# Patient Record
Sex: Female | Born: 1945
Health system: Southern US, Community
[De-identification: ages and names within clinical notes are randomized; demographics above are authoritative.]

## PROBLEM LIST (undated history)

## (undated) DIAGNOSIS — M1990 Unspecified osteoarthritis, unspecified site: Secondary | ICD-10-CM

## (undated) DIAGNOSIS — M8580 Other specified disorders of bone density and structure, unspecified site: Secondary | ICD-10-CM

## (undated) DIAGNOSIS — M6530 Trigger finger, unspecified finger: Secondary | ICD-10-CM

## (undated) DIAGNOSIS — H409 Unspecified glaucoma: Secondary | ICD-10-CM

## (undated) DIAGNOSIS — C50512 Malignant neoplasm of lower-outer quadrant of left female breast: Principal | ICD-10-CM

## (undated) DIAGNOSIS — IMO0002 Reserved for concepts with insufficient information to code with codable children: Secondary | ICD-10-CM

## (undated) DIAGNOSIS — K219 Gastro-esophageal reflux disease without esophagitis: Secondary | ICD-10-CM

## (undated) HISTORY — PX: CATARACT EXTRACTION: SUR2

## (undated) HISTORY — DX: Trigger finger, unspecified finger: M65.30

## (undated) HISTORY — PX: COLONOSCOPY: SHX174

## (undated) HISTORY — PX: TONSILLECTOMY: SUR1361

## (undated) HISTORY — DX: Malignant neoplasm of lower-outer quadrant of left female breast: C50.512

## (undated) HISTORY — DX: Reserved for concepts with insufficient information to code with codable children: IMO0002

## (undated) HISTORY — DX: Other specified disorders of bone density and structure, unspecified site: M85.80

---

## 1975-03-05 HISTORY — PX: TUBAL LIGATION: SHX77

## 1997-11-02 ENCOUNTER — Other Ambulatory Visit: Admission: RE | Admit: 1997-11-02 | Discharge: 1997-11-02 | Payer: Self-pay | Admitting: Obstetrics and Gynecology

## 1997-12-19 ENCOUNTER — Ambulatory Visit (HOSPITAL_COMMUNITY): Admission: RE | Admit: 1997-12-19 | Discharge: 1997-12-19 | Payer: Self-pay | Admitting: Obstetrics and Gynecology

## 1997-12-19 ENCOUNTER — Encounter: Payer: Self-pay | Admitting: Obstetrics and Gynecology

## 1998-12-18 ENCOUNTER — Other Ambulatory Visit: Admission: RE | Admit: 1998-12-18 | Discharge: 1998-12-18 | Payer: Self-pay | Admitting: Obstetrics and Gynecology

## 1998-12-22 ENCOUNTER — Encounter: Payer: Self-pay | Admitting: Obstetrics and Gynecology

## 1998-12-22 ENCOUNTER — Ambulatory Visit (HOSPITAL_COMMUNITY): Admission: RE | Admit: 1998-12-22 | Discharge: 1998-12-22 | Payer: Self-pay | Admitting: Obstetrics and Gynecology

## 1999-12-24 ENCOUNTER — Ambulatory Visit (HOSPITAL_COMMUNITY): Admission: RE | Admit: 1999-12-24 | Discharge: 1999-12-24 | Payer: Self-pay | Admitting: Obstetrics and Gynecology

## 1999-12-24 ENCOUNTER — Encounter: Payer: Self-pay | Admitting: Obstetrics and Gynecology

## 2000-03-10 ENCOUNTER — Other Ambulatory Visit: Admission: RE | Admit: 2000-03-10 | Discharge: 2000-03-10 | Payer: Self-pay | Admitting: Obstetrics and Gynecology

## 2000-12-26 ENCOUNTER — Encounter: Payer: Self-pay | Admitting: Obstetrics and Gynecology

## 2000-12-26 ENCOUNTER — Ambulatory Visit (HOSPITAL_COMMUNITY): Admission: RE | Admit: 2000-12-26 | Discharge: 2000-12-26 | Payer: Self-pay | Admitting: Obstetrics and Gynecology

## 2001-06-01 ENCOUNTER — Other Ambulatory Visit: Admission: RE | Admit: 2001-06-01 | Discharge: 2001-06-01 | Payer: Self-pay | Admitting: Obstetrics and Gynecology

## 2002-01-07 ENCOUNTER — Encounter: Payer: Self-pay | Admitting: Obstetrics and Gynecology

## 2002-01-07 ENCOUNTER — Ambulatory Visit (HOSPITAL_COMMUNITY): Admission: RE | Admit: 2002-01-07 | Discharge: 2002-01-07 | Payer: Self-pay | Admitting: Obstetrics and Gynecology

## 2002-06-15 ENCOUNTER — Other Ambulatory Visit: Admission: RE | Admit: 2002-06-15 | Discharge: 2002-06-15 | Payer: Self-pay | Admitting: Obstetrics and Gynecology

## 2003-01-21 ENCOUNTER — Ambulatory Visit (HOSPITAL_COMMUNITY): Admission: RE | Admit: 2003-01-21 | Discharge: 2003-01-21 | Payer: Self-pay | Admitting: Obstetrics and Gynecology

## 2003-07-05 ENCOUNTER — Other Ambulatory Visit: Admission: RE | Admit: 2003-07-05 | Discharge: 2003-07-05 | Payer: Self-pay | Admitting: Obstetrics and Gynecology

## 2004-02-01 ENCOUNTER — Ambulatory Visit (HOSPITAL_COMMUNITY): Admission: RE | Admit: 2004-02-01 | Discharge: 2004-02-01 | Payer: Self-pay | Admitting: Obstetrics and Gynecology

## 2004-07-05 ENCOUNTER — Other Ambulatory Visit: Admission: RE | Admit: 2004-07-05 | Discharge: 2004-07-05 | Payer: Self-pay | Admitting: Addiction Medicine

## 2005-02-13 ENCOUNTER — Ambulatory Visit (HOSPITAL_COMMUNITY): Admission: RE | Admit: 2005-02-13 | Discharge: 2005-02-13 | Payer: Self-pay | Admitting: Obstetrics and Gynecology

## 2005-07-16 ENCOUNTER — Other Ambulatory Visit: Admission: RE | Admit: 2005-07-16 | Discharge: 2005-07-16 | Payer: Self-pay | Admitting: Obstetrics and Gynecology

## 2006-02-20 ENCOUNTER — Ambulatory Visit (HOSPITAL_COMMUNITY): Admission: RE | Admit: 2006-02-20 | Discharge: 2006-02-20 | Payer: Self-pay | Admitting: Obstetrics and Gynecology

## 2006-07-30 ENCOUNTER — Other Ambulatory Visit: Admission: RE | Admit: 2006-07-30 | Discharge: 2006-07-30 | Payer: Self-pay | Admitting: Obstetrics and Gynecology

## 2007-02-23 ENCOUNTER — Ambulatory Visit (HOSPITAL_COMMUNITY): Admission: RE | Admit: 2007-02-23 | Discharge: 2007-02-23 | Payer: Self-pay | Admitting: Obstetrics and Gynecology

## 2007-07-31 ENCOUNTER — Other Ambulatory Visit: Admission: RE | Admit: 2007-07-31 | Discharge: 2007-07-31 | Payer: Self-pay | Admitting: Obstetrics and Gynecology

## 2008-02-24 ENCOUNTER — Ambulatory Visit (HOSPITAL_COMMUNITY): Admission: RE | Admit: 2008-02-24 | Discharge: 2008-02-24 | Payer: Self-pay | Admitting: Obstetrics and Gynecology

## 2008-08-08 ENCOUNTER — Other Ambulatory Visit: Admission: RE | Admit: 2008-08-08 | Discharge: 2008-08-08 | Payer: Self-pay | Admitting: Obstetrics and Gynecology

## 2008-08-08 ENCOUNTER — Encounter: Payer: Self-pay | Admitting: Obstetrics and Gynecology

## 2008-08-08 ENCOUNTER — Ambulatory Visit: Payer: Self-pay | Admitting: Obstetrics and Gynecology

## 2009-03-24 ENCOUNTER — Ambulatory Visit (HOSPITAL_COMMUNITY): Admission: RE | Admit: 2009-03-24 | Discharge: 2009-03-24 | Payer: Self-pay | Admitting: Obstetrics and Gynecology

## 2009-08-15 ENCOUNTER — Ambulatory Visit: Payer: Self-pay | Admitting: Obstetrics and Gynecology

## 2009-08-15 ENCOUNTER — Other Ambulatory Visit: Admission: RE | Admit: 2009-08-15 | Discharge: 2009-08-15 | Payer: Self-pay | Admitting: Obstetrics and Gynecology

## 2010-03-25 ENCOUNTER — Encounter: Payer: Self-pay | Admitting: Obstetrics and Gynecology

## 2010-04-03 ENCOUNTER — Ambulatory Visit (HOSPITAL_COMMUNITY)
Admission: RE | Admit: 2010-04-03 | Discharge: 2010-04-03 | Payer: Self-pay | Source: Home / Self Care | Attending: Obstetrics and Gynecology | Admitting: Obstetrics and Gynecology

## 2010-08-22 ENCOUNTER — Encounter: Payer: Self-pay | Admitting: Obstetrics and Gynecology

## 2010-08-22 ENCOUNTER — Encounter (INDEPENDENT_AMBULATORY_CARE_PROVIDER_SITE_OTHER): Payer: BC Managed Care – PPO | Admitting: Obstetrics and Gynecology

## 2010-08-22 ENCOUNTER — Other Ambulatory Visit: Payer: Self-pay | Admitting: Obstetrics and Gynecology

## 2010-08-22 ENCOUNTER — Other Ambulatory Visit (HOSPITAL_COMMUNITY)
Admission: RE | Admit: 2010-08-22 | Discharge: 2010-08-22 | Disposition: A | Discharge status: Home / Self Care | Payer: BC Managed Care – PPO | Source: Ambulatory Visit | Attending: Obstetrics and Gynecology | Admitting: Obstetrics and Gynecology

## 2010-08-22 DIAGNOSIS — R809 Proteinuria, unspecified: Secondary | ICD-10-CM

## 2010-08-22 DIAGNOSIS — Z01419 Encounter for gynecological examination (general) (routine) without abnormal findings: Secondary | ICD-10-CM

## 2010-08-22 DIAGNOSIS — Z124 Encounter for screening for malignant neoplasm of cervix: Secondary | ICD-10-CM | POA: Insufficient documentation

## 2010-09-04 ENCOUNTER — Encounter (INDEPENDENT_AMBULATORY_CARE_PROVIDER_SITE_OTHER): Payer: BC Managed Care – PPO

## 2010-09-04 DIAGNOSIS — M949 Disorder of cartilage, unspecified: Secondary | ICD-10-CM

## 2010-09-04 DIAGNOSIS — M899 Disorder of bone, unspecified: Secondary | ICD-10-CM

## 2010-09-06 ENCOUNTER — Encounter: Payer: Self-pay | Admitting: Obstetrics and Gynecology

## 2011-03-11 ENCOUNTER — Other Ambulatory Visit: Payer: Self-pay | Admitting: Obstetrics and Gynecology

## 2011-03-11 DIAGNOSIS — Z1231 Encounter for screening mammogram for malignant neoplasm of breast: Secondary | ICD-10-CM

## 2011-04-08 ENCOUNTER — Ambulatory Visit (HOSPITAL_COMMUNITY): Payer: BC Managed Care – PPO | Attending: Obstetrics and Gynecology

## 2011-05-09 ENCOUNTER — Ambulatory Visit (HOSPITAL_COMMUNITY)
Admission: RE | Admit: 2011-05-09 | Discharge: 2011-05-09 | Disposition: A | Discharge status: Home / Self Care | Payer: Medicare Other | Source: Ambulatory Visit | Attending: Obstetrics and Gynecology | Admitting: Obstetrics and Gynecology

## 2011-05-09 DIAGNOSIS — Z1231 Encounter for screening mammogram for malignant neoplasm of breast: Secondary | ICD-10-CM | POA: Insufficient documentation

## 2011-05-16 ENCOUNTER — Other Ambulatory Visit: Payer: Self-pay | Admitting: *Deleted

## 2011-05-16 DIAGNOSIS — R921 Mammographic calcification found on diagnostic imaging of breast: Secondary | ICD-10-CM

## 2011-05-22 ENCOUNTER — Ambulatory Visit
Admission: RE | Admit: 2011-05-22 | Discharge: 2011-05-22 | Disposition: A | Discharge status: Home / Self Care | Payer: Medicare Other | Source: Ambulatory Visit | Attending: Gynecology | Admitting: Gynecology

## 2011-05-22 DIAGNOSIS — R921 Mammographic calcification found on diagnostic imaging of breast: Secondary | ICD-10-CM

## 2011-08-27 ENCOUNTER — Encounter: Payer: Self-pay | Admitting: Obstetrics and Gynecology

## 2011-08-27 ENCOUNTER — Ambulatory Visit (INDEPENDENT_AMBULATORY_CARE_PROVIDER_SITE_OTHER): Payer: Medicare Other | Admitting: Obstetrics and Gynecology

## 2011-08-27 VITALS — BP 120/72 | Ht 62.0 in | Wt 156.0 lb

## 2011-08-27 DIAGNOSIS — Z780 Asymptomatic menopausal state: Secondary | ICD-10-CM

## 2011-08-27 DIAGNOSIS — R351 Nocturia: Secondary | ICD-10-CM

## 2011-08-27 DIAGNOSIS — N952 Postmenopausal atrophic vaginitis: Secondary | ICD-10-CM

## 2011-08-27 DIAGNOSIS — H409 Unspecified glaucoma: Secondary | ICD-10-CM | POA: Insufficient documentation

## 2011-08-27 DIAGNOSIS — M949 Disorder of cartilage, unspecified: Secondary | ICD-10-CM

## 2011-08-27 DIAGNOSIS — R928 Other abnormal and inconclusive findings on diagnostic imaging of breast: Secondary | ICD-10-CM

## 2011-08-27 DIAGNOSIS — M8580 Other specified disorders of bone density and structure, unspecified site: Secondary | ICD-10-CM | POA: Insufficient documentation

## 2011-08-27 DIAGNOSIS — R921 Mammographic calcification found on diagnostic imaging of breast: Secondary | ICD-10-CM

## 2011-08-27 NOTE — Progress Notes (Signed)
Patient came to see me today for further followup. When she went for her mammogram in March she had calcifications in her left breast. She had additional views and they were felt to be benign. She is scheduled for six-month followup. She does not take hormone replacement. She does have some mild menopausal symptoms consisting of hot flashes but they are tolerable. She does have atrophic vaginitis with intercourse is comfortable. She will get occasional nocturia x1. She is not having dysuria, urgency, hematuria or incontinence. She is having no vaginal bleeding. She is having no pelvic pain. She has always had normal Pap smears. Her last Pap smear was last year. She has osteopenia on bone density. Her last bone density was July of 2012. She had lost some additional bone in  her hip. Her fracture risk was nonelevated. She takes calcium and vitamin D. She has had no fractures.  ROS: 12 system review done. Pertinent positives above. Other positive  is glaucoma.  Physical examination:Kim Gardner present HEENT within normal limits. Neck: Thyroid not large. No masses. Supraclavicular nodes: not enlarged. Breasts: Examined in both sitting and lying  position. No skin changes and no masses. Abdomen: Soft no guarding rebound or masses or hernia. Pelvic: External: Within normal limits. BUS: Within normal limits. Vaginal:within normal limits. Poor  estrogen effect. No evidence of cystocele rectocele or enterocele. Cervix: clean. Uterus: Normal size and shape. Adnexa: No masses. Rectovaginal exam: Confirmatory and negative. Extremities: Within normal limits.  Assessment: #1. Atrophic vaginitis #2. Menopausal symptoms #3. Calcifications left breast-nonsuspicious #4. Osteopenia #5. Nocturia  Plan: Mammogram in September. Bone density in 2014. Call when necessary need for estrogen cream.

## 2011-09-02 ENCOUNTER — Encounter: Payer: Self-pay | Admitting: Obstetrics and Gynecology

## 2011-11-19 ENCOUNTER — Ambulatory Visit
Admission: RE | Admit: 2011-11-19 | Discharge: 2011-11-19 | Disposition: A | Discharge status: Home / Self Care | Payer: Medicare Other | Source: Ambulatory Visit | Attending: Gynecology | Admitting: Gynecology

## 2011-11-19 DIAGNOSIS — R921 Mammographic calcification found on diagnostic imaging of breast: Secondary | ICD-10-CM

## 2012-04-21 ENCOUNTER — Other Ambulatory Visit: Payer: Self-pay | Admitting: Family Medicine

## 2012-04-21 DIAGNOSIS — R921 Mammographic calcification found on diagnostic imaging of breast: Secondary | ICD-10-CM

## 2012-05-20 ENCOUNTER — Ambulatory Visit
Admission: RE | Admit: 2012-05-20 | Discharge: 2012-05-20 | Disposition: A | Discharge status: Home / Self Care | Payer: Medicare Other | Source: Ambulatory Visit | Attending: Family Medicine | Admitting: Family Medicine

## 2012-05-20 DIAGNOSIS — R921 Mammographic calcification found on diagnostic imaging of breast: Secondary | ICD-10-CM

## 2012-09-08 ENCOUNTER — Ambulatory Visit (INDEPENDENT_AMBULATORY_CARE_PROVIDER_SITE_OTHER): Payer: Medicare Other | Admitting: Gynecology

## 2012-09-08 ENCOUNTER — Encounter: Payer: Self-pay | Admitting: Gynecology

## 2012-09-08 VITALS — BP 104/64 | Ht 61.25 in | Wt 153.0 lb

## 2012-09-08 DIAGNOSIS — M8580 Other specified disorders of bone density and structure, unspecified site: Secondary | ICD-10-CM

## 2012-09-08 DIAGNOSIS — M949 Disorder of cartilage, unspecified: Secondary | ICD-10-CM

## 2012-09-08 NOTE — Progress Notes (Signed)
Kiara Ruiz 1945-06-16 [id number]        67 y.o.  G1P1001 for followup exam.  Former patient of Dr. Eda Paschal.  Past medical history,surgical history, medications, allergies, family history and social history were all reviewed and documented in the EPIC chart.  ROS:  Performed and pertinent positives and negatives are included in the history, assessment and plan .  Exam: [name] assistant Filed Vitals:   09/08/12 1456  BP: 104/64  Height: 5' 1.25" (1.556 m)  Weight: 153 lb (69.4 kg)   General appearance  Normal Skin grossly normal Head/Neck normal with no cervical or supraclavicular adenopathy thyroid normal Lungs  clear Cardiac RR, without RMG Abdominal  soft, nontender, without masses, organomegaly or hernia Breasts  examined lying and sitting without masses, retractions, discharge or axillary adenopathy. Pelvic  Ext/BUS/vagina  normal with atrophic changes  Cervix  normal with atrophic changes flush with the upper vagina  Uterus  axial, normal size, shape and contour, midline and mobile nontender   Adnexa  Without masses or tenderness    Anus and perineum  normal   Rectovaginal  normal sphincter tone without palpated masses or tenderness.    Assessment/Plan:  67 y.o. G60P1001 female for annual exam.   1. Atrophic vaginal changes/post menopausal. Patient without significant symptoms such as hot flashes night sweats vaginal dryness or dyspareunia. We'll continue to monitor. She'll call if there any issues or she has any bleeding at all. 2. Osteopenia. DEXA 7 2012 with T score -1.8 FRAX 4.4%/0.6%. Increase calcium vitamin D reviewed. Repeat DEXA now a two-year interval. 3. Mammography 05/2012. Continue with annual mammography. SBE monthly reviewed. 4. Pap smear 2012. No Pap smear done today. No history of abnormal Pap smears previously. Reviewed current screening guidelines. Options discussed renal together she is over the age of 32 versus less frequent screening intervals  reviewed. We'll readdress on an annual basis. 5. Colonoscopy 2008. She was recommended to repeat at 10 year interval. 6. Health maintenance. No lab work done as it is all done through her primary physician's office. Followup one year, sooner as needed.   Note: This document was prepared with digital dictation and possible smart phrase technology. Any transcriptional errors that result from this process are unintentional.   Dara Lords MD, 3:23 PM 09/08/2012

## 2012-09-08 NOTE — Patient Instructions (Signed)
Follow up in one year for annual exam 

## 2012-11-05 ENCOUNTER — Encounter: Payer: Self-pay | Admitting: Gynecology

## 2012-11-05 ENCOUNTER — Ambulatory Visit (INDEPENDENT_AMBULATORY_CARE_PROVIDER_SITE_OTHER): Payer: Medicare Other

## 2012-11-05 DIAGNOSIS — M8580 Other specified disorders of bone density and structure, unspecified site: Secondary | ICD-10-CM

## 2012-11-05 DIAGNOSIS — M899 Disorder of bone, unspecified: Secondary | ICD-10-CM

## 2012-11-26 ENCOUNTER — Ambulatory Visit
Admission: RE | Admit: 2012-11-26 | Discharge: 2012-11-26 | Disposition: A | Discharge status: Home / Self Care | Payer: 59 | Source: Ambulatory Visit | Attending: Family Medicine | Admitting: Family Medicine

## 2012-11-26 ENCOUNTER — Other Ambulatory Visit: Payer: Self-pay | Admitting: Family Medicine

## 2012-11-26 DIAGNOSIS — M12529 Traumatic arthropathy, unspecified elbow: Secondary | ICD-10-CM

## 2013-04-20 ENCOUNTER — Other Ambulatory Visit: Payer: Self-pay | Admitting: Gynecology

## 2013-04-20 DIAGNOSIS — R921 Mammographic calcification found on diagnostic imaging of breast: Secondary | ICD-10-CM

## 2013-05-24 ENCOUNTER — Ambulatory Visit
Admission: RE | Admit: 2013-05-24 | Discharge: 2013-05-24 | Disposition: A | Discharge status: Home / Self Care | Payer: Medicare Other | Source: Ambulatory Visit | Attending: Gynecology | Admitting: Gynecology

## 2013-05-24 DIAGNOSIS — R921 Mammographic calcification found on diagnostic imaging of breast: Secondary | ICD-10-CM

## 2013-06-11 ENCOUNTER — Ambulatory Visit
Admission: RE | Admit: 2013-06-11 | Discharge: 2013-06-11 | Disposition: A | Discharge status: Home / Self Care | Payer: Medicare Other | Source: Ambulatory Visit | Attending: Family Medicine | Admitting: Family Medicine

## 2013-06-11 ENCOUNTER — Other Ambulatory Visit: Payer: Self-pay | Admitting: Family Medicine

## 2013-06-11 DIAGNOSIS — M1250 Traumatic arthropathy, unspecified site: Secondary | ICD-10-CM

## 2013-09-01 DIAGNOSIS — IMO0002 Reserved for concepts with insufficient information to code with codable children: Secondary | ICD-10-CM

## 2013-09-01 HISTORY — DX: Reserved for concepts with insufficient information to code with codable children: IMO0002

## 2013-09-09 ENCOUNTER — Ambulatory Visit (INDEPENDENT_AMBULATORY_CARE_PROVIDER_SITE_OTHER): Payer: Medicare Other | Admitting: Gynecology

## 2013-09-09 ENCOUNTER — Other Ambulatory Visit (HOSPITAL_COMMUNITY)
Admission: RE | Admit: 2013-09-09 | Discharge: 2013-09-09 | Disposition: A | Discharge status: Home / Self Care | Payer: Medicare Other | Source: Ambulatory Visit | Attending: Gynecology | Admitting: Gynecology

## 2013-09-09 ENCOUNTER — Encounter: Payer: Self-pay | Admitting: Gynecology

## 2013-09-09 VITALS — BP 120/70 | Ht 62.0 in | Wt 156.0 lb

## 2013-09-09 DIAGNOSIS — Z1151 Encounter for screening for human papillomavirus (HPV): Secondary | ICD-10-CM | POA: Diagnosis present

## 2013-09-09 DIAGNOSIS — Z124 Encounter for screening for malignant neoplasm of cervix: Secondary | ICD-10-CM | POA: Diagnosis present

## 2013-09-09 DIAGNOSIS — R87619 Unspecified abnormal cytological findings in specimens from cervix uteri: Secondary | ICD-10-CM | POA: Diagnosis present

## 2013-09-09 DIAGNOSIS — N952 Postmenopausal atrophic vaginitis: Secondary | ICD-10-CM

## 2013-09-09 DIAGNOSIS — M899 Disorder of bone, unspecified: Secondary | ICD-10-CM

## 2013-09-09 DIAGNOSIS — M8580 Other specified disorders of bone density and structure, unspecified site: Secondary | ICD-10-CM

## 2013-09-09 DIAGNOSIS — M949 Disorder of cartilage, unspecified: Secondary | ICD-10-CM

## 2013-09-09 NOTE — Progress Notes (Signed)
Kiara Ruiz 10/07/1945 [id number]        68 y.o.  G1P1001 for followup exam. Several issues noted below.  Past medical history,surgical history, problem list, medications, allergies, family history and social history were all reviewed and documented as reviewed in the EPIC chart.  ROS:  12 system ROS performed with pertinent positives and negatives included in the history, assessment and plan.   Additional significant findings :  None   Exam: Kim Counsellor Vitals:   09/09/13 1432  BP: 120/70  Height: 5\' 2"  (1.575 m)  Weight: 156 lb (70.761 kg)   General appearance:  Normal affect, orientation and appearance. Skin: Grossly normal HEENT: Without gross lesions.  No cervical or supraclavicular adenopathy. Thyroid normal.  Lungs:  Clear without wheezing, rales or rhonchi Cardiac: RR, without RMG Abdominal:  Soft, nontender, without masses, guarding, rebound, organomegaly or hernia Breasts:  Examined lying and sitting without masses, retractions, discharge or axillary adenopathy. Pelvic:  Ext/BUS/vagina with generalized atrophic changes  Cervix with atrophic changes. Pap done  Uterus anteverted, normal size, shape and contour, midline and mobile nontender   Adnexa  Without masses or tenderness    Anus and perineum  Normal   Rectovaginal  Normal sphincter tone without palpated masses or tenderness.    Assessment/Plan:  68 y.o. G1P1001 female for followup exam.   1. Postmenopausal/atrophic genital changes. Doing well without significant hot flushes, night sweats, vaginal dryness. Is not sexually active. No vaginal bleeding. Will continue to monitor. Report any vaginal bleeding. 2. Osteopenia. DEXA 11/2012 T score -1.8. FRAX 4.6%/0.7%. Stable from prior DEXA. Continue to monitor. Increase calcium vitamin D reviewed. 3. Pap smear 2012. Pap done today. No history of significant abnormal Pap smears. Reviewed options of stop screening versus less frequent screening intervals per  current screening guidelines. We'll readdress on an annual basis. 4. Mammography 05/2013. Continue with annual mammography. SBE monthly review. 5. Colonoscopy 2008. Repeat at their recommended interval. 6. Health maintenance. No blood work done as this is done at her primary physician's office. Followup one year, sooner as needed   Note: This document was prepared with digital dictation and possible smart phrase technology. Any transcriptional errors that result from this process are unintentional.   Anastasio Auerbach MD, 2:49 PM 09/09/2013

## 2013-09-09 NOTE — Addendum Note (Signed)
Addended by: Nelva Nay on: 09/09/2013 02:55 PM   Modules accepted: Orders

## 2013-09-09 NOTE — Patient Instructions (Signed)
Followup in one year, sooner as needed.  You may obtain a copy of any labs that were done today by logging onto MyChart as outlined in the instructions provided with your AVS (after visit summary). The office will not call with normal lab results but certainly if there are any significant abnormalities then we will contact you.   Health Maintenance, Female A healthy lifestyle and preventative care can promote health and wellness.  Maintain regular health, dental, and eye exams.  Eat a healthy diet. Foods like vegetables, fruits, whole grains, low-fat dairy products, and lean protein foods contain the nutrients you need without too many calories. Decrease your intake of foods high in solid fats, added sugars, and salt. Get information about a proper diet from your caregiver, if necessary.  Regular physical exercise is one of the most important things you can do for your health. Most adults should get at least 150 minutes of moderate-intensity exercise (any activity that increases your heart rate and causes you to sweat) each week. In addition, most adults need muscle-strengthening exercises on 2 or more days a week.   Maintain a healthy weight. The body mass index (BMI) is a screening tool to identify possible weight problems. It provides an estimate of body fat based on height and weight. Your caregiver can help determine your BMI, and can help you achieve or maintain a healthy weight. For adults 20 years and older:  A BMI below 18.5 is considered underweight.  A BMI of 18.5 to 24.9 is normal.  A BMI of 25 to 29.9 is considered overweight.  A BMI of 30 and above is considered obese.  Maintain normal blood lipids and cholesterol by exercising and minimizing your intake of saturated fat. Eat a balanced diet with plenty of fruits and vegetables. Blood tests for lipids and cholesterol should begin at age 41 and be repeated every 5 years. If your lipid or cholesterol levels are high, you are over  50, or you are a high risk for heart disease, you may need your cholesterol levels checked more frequently.Ongoing high lipid and cholesterol levels should be treated with medicines if diet and exercise are not effective.  If you smoke, find out from your caregiver how to quit. If you do not use tobacco, do not start.  Lung cancer screening is recommended for adults aged 49 80 years who are at high risk for developing lung cancer because of a history of smoking. Yearly low-dose computed tomography (CT) is recommended for people who have at least a 30-pack-year history of smoking and are a current smoker or have quit within the past 15 years. A pack year of smoking is smoking an average of 1 pack of cigarettes a day for 1 year (for example: 1 pack a day for 30 years or 2 packs a day for 15 years). Yearly screening should continue until the smoker has stopped smoking for at least 15 years. Yearly screening should also be stopped for people who develop a health problem that would prevent them from having lung cancer treatment.  If you are pregnant, do not drink alcohol. If you are breastfeeding, be very cautious about drinking alcohol. If you are not pregnant and choose to drink alcohol, do not exceed 1 drink per day. One drink is considered to be 12 ounces (355 mL) of beer, 5 ounces (148 mL) of wine, or 1.5 ounces (44 mL) of liquor.  Avoid use of street drugs. Do not share needles with anyone. Ask for help  if you need support or instructions about stopping the use of drugs.  High blood pressure causes heart disease and increases the risk of stroke. Blood pressure should be checked at least every 1 to 2 years. Ongoing high blood pressure should be treated with medicines, if weight loss and exercise are not effective.  If you are 55 to 69 years old, ask your caregiver if you should take aspirin to prevent strokes.  Diabetes screening involves taking a blood sample to check your fasting blood sugar level.  This should be done once every 3 years, after age 45, if you are within normal weight and without risk factors for diabetes. Testing should be considered at a younger age or be carried out more frequently if you are overweight and have at least 1 risk factor for diabetes.  Breast cancer screening is essential preventative care for women. You should practice "breast self-awareness." This means understanding the normal appearance and feel of your breasts and may include breast self-examination. Any changes detected, no matter how small, should be reported to a caregiver. Women in their 20s and 30s should have a clinical breast exam (CBE) by a caregiver as part of a regular health exam every 1 to 3 years. After age 40, women should have a CBE every year. Starting at age 40, women should consider having a mammogram (breast X-Meyers) every year. Women who have a family history of breast cancer should talk to their caregiver about genetic screening. Women at a high risk of breast cancer should talk to their caregiver about having an MRI and a mammogram every year.  Breast cancer gene (BRCA)-related cancer risk assessment is recommended for women who have family members with BRCA-related cancers. BRCA-related cancers include breast, ovarian, tubal, and peritoneal cancers. Having family members with these cancers may be associated with an increased risk for harmful changes (mutations) in the breast cancer genes BRCA1 and BRCA2. Results of the assessment will determine the need for genetic counseling and BRCA1 and BRCA2 testing.  The Pap test is a screening test for cervical cancer. Women should have a Pap test starting at age 21. Between ages 21 and 29, Pap tests should be repeated every 2 years. Beginning at age 30, you should have a Pap test every 3 years as long as the past 3 Pap tests have been normal. If you had a hysterectomy for a problem that was not cancer or a condition that could lead to cancer, then you no  longer need Pap tests. If you are between ages 65 and 70, and you have had normal Pap tests going back 10 years, you no longer need Pap tests. If you have had past treatment for cervical cancer or a condition that could lead to cancer, you need Pap tests and screening for cancer for at least 20 years after your treatment. If Pap tests have been discontinued, risk factors (such as a new sexual partner) need to be reassessed to determine if screening should be resumed. Some women have medical problems that increase the chance of getting cervical cancer. In these cases, your caregiver may recommend more frequent screening and Pap tests.  The human papillomavirus (HPV) test is an additional test that may be used for cervical cancer screening. The HPV test looks for the virus that can cause the cell changes on the cervix. The cells collected during the Pap test can be tested for HPV. The HPV test could be used to screen women aged 30 years and older, and   should be used in women of any age who have unclear Pap test results. After the age of 30, women should have HPV testing at the same frequency as a Pap test.  Colorectal cancer can be detected and often prevented. Most routine colorectal cancer screening begins at the age of 50 and continues through age 75. However, your caregiver may recommend screening at an earlier age if you have risk factors for colon cancer. On a yearly basis, your caregiver may provide home test kits to check for hidden blood in the stool. Use of a small camera at the end of a tube, to directly examine the colon (sigmoidoscopy or colonoscopy), can detect the earliest forms of colorectal cancer. Talk to your caregiver about this at age 50, when routine screening begins. Direct examination of the colon should be repeated every 5 to 10 years through age 75, unless early forms of pre-cancerous polyps or small growths are found.  Hepatitis C blood testing is recommended for all people born from  1945 through 1965 and any individual with known risks for hepatitis C.  Practice safe sex. Use condoms and avoid high-risk sexual practices to reduce the spread of sexually transmitted infections (STIs). Sexually active women aged 25 and younger should be checked for Chlamydia, which is a common sexually transmitted infection. Older women with new or multiple partners should also be tested for Chlamydia. Testing for other STIs is recommended if you are sexually active and at increased risk.  Osteoporosis is a disease in which the bones lose minerals and strength with aging. This can result in serious bone fractures. The risk of osteoporosis can be identified using a bone density scan. Women ages 65 and over and women at risk for fractures or osteoporosis should discuss screening with their caregivers. Ask your caregiver whether you should be taking a calcium supplement or vitamin D to reduce the rate of osteoporosis.  Menopause can be associated with physical symptoms and risks. Hormone replacement therapy is available to decrease symptoms and risks. You should talk to your caregiver about whether hormone replacement therapy is right for you.  Use sunscreen. Apply sunscreen liberally and repeatedly throughout the day. You should seek shade when your shadow is shorter than you. Protect yourself by wearing long sleeves, pants, a wide-brimmed hat, and sunglasses year round, whenever you are outdoors.  Notify your caregiver of new moles or changes in moles, especially if there is a change in shape or color. Also notify your caregiver if a mole is larger than the size of a pencil eraser.  Stay current with your immunizations. Document Released: 09/03/2010 Document Revised: 06/15/2012 Document Reviewed: 09/03/2010 [employer] Patient Information 2014 [employer].   

## 2013-09-13 LAB — CYTOLOGY - PAP

## 2013-09-16 ENCOUNTER — Encounter: Payer: Self-pay | Admitting: Gynecology

## 2014-01-03 ENCOUNTER — Encounter: Payer: Self-pay | Admitting: Gynecology

## 2014-04-15 ENCOUNTER — Other Ambulatory Visit (HOSPITAL_COMMUNITY)
Admission: RE | Admit: 2014-04-15 | Discharge: 2014-04-15 | Disposition: A | Discharge status: Home / Self Care | Payer: 59 | Source: Ambulatory Visit | Attending: Gynecology | Admitting: Gynecology

## 2014-04-15 ENCOUNTER — Other Ambulatory Visit: Payer: Self-pay

## 2014-04-15 ENCOUNTER — Ambulatory Visit (INDEPENDENT_AMBULATORY_CARE_PROVIDER_SITE_OTHER): Payer: Medicare Other | Admitting: Gynecology

## 2014-04-15 ENCOUNTER — Encounter: Payer: Self-pay | Admitting: Gynecology

## 2014-04-15 VITALS — BP 118/80 | Wt 156.0 lb

## 2014-04-15 DIAGNOSIS — IMO0002 Reserved for concepts with insufficient information to code with codable children: Secondary | ICD-10-CM

## 2014-04-15 DIAGNOSIS — Z1231 Encounter for screening mammogram for malignant neoplasm of breast: Secondary | ICD-10-CM

## 2014-04-15 DIAGNOSIS — Z01411 Encounter for gynecological examination (general) (routine) with abnormal findings: Secondary | ICD-10-CM | POA: Insufficient documentation

## 2014-04-15 DIAGNOSIS — R896 Abnormal cytological findings in specimens from other organs, systems and tissues: Secondary | ICD-10-CM

## 2014-04-15 NOTE — Patient Instructions (Signed)
Follow-up for Pap smear results. 

## 2014-04-15 NOTE — Progress Notes (Signed)
Kiara Ruiz Nov 16, 1945 [id number]        69 y.o.  G1P1001 Presents for repeat Pap smear.  No history of abnormal Pap smears until her most recent Pap smear 09/2013 which showed ASCUS negative high-risk HPV. I asked the patient return in 6 months to repeat this to make sure that we are not missing anything.  Past medical history,surgical history, problem list, medications, allergies, family history and social history were all reviewed and documented in the EPIC chart.  Directed ROS with pertinent positives and negatives documented in the history of present illness/assessment and plan.  Exam: Kiara Ruiz assistant General appearance:  Normal External BUS vagina with atrophic changes. Cervix with atrophic changes. Pap smear done. Uterus grossly normal size midline mobile nontender. Adnexa without masses or tenderness.  Assessment/Plan:  69 y.o. G1P1001 with first abnormal Pap smear showing ASCUS negative high-risk HPV. Pap smear done today. If abnormal then we'll plan colposcopy. If normal then plan follow up at her annual exam.     Kiara Auerbach MD, 11:41 AM 04/15/2014

## 2014-04-15 NOTE — Addendum Note (Signed)
Addended by: Burnett Kanaris on: 04/15/2014 11:50 AM   Modules accepted: Orders

## 2014-04-19 LAB — CYTOLOGY - PAP

## 2014-06-01 ENCOUNTER — Ambulatory Visit
Admission: RE | Admit: 2014-06-01 | Discharge: 2014-06-01 | Disposition: A | Discharge status: Home / Self Care | Payer: Medicare Other | Source: Ambulatory Visit

## 2014-06-01 ENCOUNTER — Encounter (HOSPITAL_COMMUNITY): Payer: Self-pay | Admitting: *Deleted

## 2014-06-01 ENCOUNTER — Emergency Department (HOSPITAL_COMMUNITY)
Admission: EM | Admit: 2014-06-01 | Discharge: 2014-06-01 | Disposition: A | Discharge status: Home / Self Care | Payer: Medicare Other | Attending: Emergency Medicine | Admitting: Emergency Medicine

## 2014-06-01 DIAGNOSIS — Z8669 Personal history of other diseases of the nervous system and sense organs: Secondary | ICD-10-CM | POA: Diagnosis not present

## 2014-06-01 DIAGNOSIS — F458 Other somatoform disorders: Secondary | ICD-10-CM | POA: Diagnosis not present

## 2014-06-01 DIAGNOSIS — Z79899 Other long term (current) drug therapy: Secondary | ICD-10-CM | POA: Diagnosis not present

## 2014-06-01 DIAGNOSIS — Z8739 Personal history of other diseases of the musculoskeletal system and connective tissue: Secondary | ICD-10-CM | POA: Diagnosis not present

## 2014-06-01 DIAGNOSIS — J029 Acute pharyngitis, unspecified: Secondary | ICD-10-CM | POA: Diagnosis present

## 2014-06-01 DIAGNOSIS — R0989 Other specified symptoms and signs involving the circulatory and respiratory systems: Secondary | ICD-10-CM

## 2014-06-01 DIAGNOSIS — Z7982 Long term (current) use of aspirin: Secondary | ICD-10-CM | POA: Insufficient documentation

## 2014-06-01 DIAGNOSIS — Z1231 Encounter for screening mammogram for malignant neoplasm of breast: Secondary | ICD-10-CM

## 2014-06-01 NOTE — ED Notes (Signed)
Pt states that she was eating fish an hr ago. States that she believes there is a bone stuck in her throat. Pt states that she has been able to swallow corn bread since. States that the bone has not passed.

## 2014-06-01 NOTE — ED Provider Notes (Signed)
CSN: [id number]     Arrival date & time 06/01/14  2149 History   First MD Initiated Contact with Patient 06/01/14 2214     No chief complaint on file.    (Consider location/radiation/quality/duration/timing/severity/associated sxs/prior Treatment) HPI Complains of fishbone  stuck in throat onset upon eating 6:30 PM tonight. Pain is worse with swallowing. Nonradiating pain is at oropharynx to the right of midline. Mild to moderate. No other associated symptoms. No dyspnea no hoarseness. No trouble swallowing No treatment prior to coming here Past Medical History  Diagnosis Date  . Osteopenia 11/2012    T score -1.8 FRAX 4.6%/0.7% stable from prior DEXA  . Glaucoma   . ASCUS favor benign 09/2013    Negative HR HPV   Past Surgical History  Procedure Laterality Date  . Breast surgery      BREAST BIOPSIES X 3 (BENIGN)  . Tonsillectomy    . Tubal ligation    . Eye surgery     Family History  Problem Relation Age of Onset  . Diabetes Mother   . Cancer Mother 10    PANCREATIC  . Hypertension Father   . Diabetes Brother    History  Substance Use Topics  . Smoking status: Never Smoker   . Smokeless tobacco: Never Used  . Alcohol Use: No   OB History    Gravida Para Term Preterm AB TAB SAB Ectopic Multiple Living   1 1 1       1      Review of Systems  Constitutional: Negative.   HENT: Positive for sore throat.   Respiratory: Negative.   Allergic/Immunologic: Negative.   All other systems reviewed and are negative.     Allergies  Review of patient's allergies indicates no known allergies.  Home Medications   Prior to Admission medications   Medication Sig Start Date End Date Taking? Authorizing Provider  aspirin 81 MG tablet Take 81 mg by mouth daily.   Yes Historical Provider, MD  CALCIUM-VITAMIN D PO Take 1 tablet by mouth daily.    Yes Historical Provider, MD  latanoprost (XALATAN) 0.005 % ophthalmic solution 1 drop at bedtime.     Yes Historical Provider, MD   Multiple Vitamin (MULTIVITAMIN PO) Take 1 tablet by mouth daily.    Yes Historical Provider, MD  timolol (BETIMOL) 0.5 % ophthalmic solution Place 1 drop into both eyes 2 (two) times daily.    Yes Historical Provider, MD   BP 137/72 mmHg  Pulse 87  Temp(Src) 97.6 F (36.4 C) (Oral)  Resp 16  Ht 5' 1.5" (1.562 m)  Wt 156 lb (70.761 kg)  BMI 29.00 kg/m2  SpO2 100% Physical Exam  Constitutional: She appears well-developed and well-nourished.  HENT:  Head: Normocephalic and atraumatic.  Right Ear: External ear normal.  Left Ear: External ear normal.  Mouth/Throat: Oropharynx is clear and moist.  Eyes: Conjunctivae are normal. Pupils are equal, round, and reactive to light.  Neck: Neck supple. No tracheal deviation present. No thyromegaly present.  Cardiovascular: Normal rate and regular rhythm.   No murmur heard. Pulmonary/Chest: Effort normal and breath sounds normal.  Abdominal: Soft. Bowel sounds are normal. She exhibits no distension. There is no tenderness.  Musculoskeletal: Normal range of motion. She exhibits no edema or tenderness.  Neurological: She is alert. Coordination normal.  Skin: Skin is warm and dry. No rash noted.  Psychiatric: She has a normal mood and affect.  Nursing note and vitals reviewed.   ED Course  Procedures (including  critical care time) Labs Review Labs Reviewed - No data to display  Imaging Review No results found.   EKG Interpretation None      MDM  I explained the patient that she may have retained foreign body in throat or it may be an abrasion of oropharynx. I can visualize no foreign object. I spoke with Dr.Wolicki. Plan patient to be evaluated in office tomorrow she still has foreign body sensation Globus sensation Final diagnoses:  None        Orlie Dakin, MD 06/01/14 2257

## 2014-06-01 NOTE — Discharge Instructions (Signed)
Did not eat or drink anything tonight after midnight. Call Dr.Wolicki's office after 9 AM tomorrow if you still have foreign body sensation in your throat.

## 2014-08-29 ENCOUNTER — Other Ambulatory Visit: Payer: Self-pay

## 2014-10-05 ENCOUNTER — Encounter: Payer: Medicare Other | Admitting: Gynecology

## 2014-11-30 ENCOUNTER — Encounter: Payer: Medicare Other | Admitting: Gynecology

## 2015-02-02 DIAGNOSIS — M8580 Other specified disorders of bone density and structure, unspecified site: Secondary | ICD-10-CM

## 2015-02-02 HISTORY — DX: Other specified disorders of bone density and structure, unspecified site: M85.80

## 2015-02-06 ENCOUNTER — Other Ambulatory Visit (HOSPITAL_COMMUNITY)
Admission: RE | Admit: 2015-02-06 | Discharge: 2015-02-06 | Disposition: A | Discharge status: Home / Self Care | Payer: 59 | Source: Ambulatory Visit | Attending: Gynecology | Admitting: Gynecology

## 2015-02-06 ENCOUNTER — Ambulatory Visit (INDEPENDENT_AMBULATORY_CARE_PROVIDER_SITE_OTHER): Payer: Medicare Other | Admitting: Gynecology

## 2015-02-06 ENCOUNTER — Encounter: Payer: Self-pay | Admitting: Gynecology

## 2015-02-06 VITALS — BP 122/76 | Ht 62.0 in | Wt 158.0 lb

## 2015-02-06 DIAGNOSIS — M899 Disorder of bone, unspecified: Secondary | ICD-10-CM | POA: Diagnosis not present

## 2015-02-06 DIAGNOSIS — R896 Abnormal cytological findings in specimens from other organs, systems and tissues: Secondary | ICD-10-CM

## 2015-02-06 DIAGNOSIS — N952 Postmenopausal atrophic vaginitis: Secondary | ICD-10-CM

## 2015-02-06 DIAGNOSIS — Z124 Encounter for screening for malignant neoplasm of cervix: Secondary | ICD-10-CM | POA: Insufficient documentation

## 2015-02-06 DIAGNOSIS — M8580 Other specified disorders of bone density and structure, unspecified site: Secondary | ICD-10-CM

## 2015-02-06 DIAGNOSIS — IMO0002 Reserved for concepts with insufficient information to code with codable children: Secondary | ICD-10-CM

## 2015-02-06 DIAGNOSIS — Z01419 Encounter for gynecological examination (general) (routine) without abnormal findings: Secondary | ICD-10-CM

## 2015-02-06 NOTE — Patient Instructions (Signed)
Follow up for bone density as scheduled.  You may obtain a copy of any labs that were done today by logging onto MyChart as outlined in the instructions provided with your AVS (after visit summary). The office will not call with normal lab results but certainly if there are any significant abnormalities then we will contact you.   Health Maintenance Adopting a healthy lifestyle and getting preventive care can go a long way to promote health and wellness. Talk with your health care provider about what schedule of regular examinations is right for you. This is a good chance for you to check in with your provider about disease prevention and staying healthy. In between checkups, there are plenty of things you can do on your own. Experts have done a lot of research about which lifestyle changes and preventive measures are most likely to keep you healthy. Ask your health care provider for more information. WEIGHT AND DIET  Eat a healthy diet  Be sure to include plenty of vegetables, fruits, low-fat dairy products, and lean protein.  Do not eat a lot of foods high in solid fats, added sugars, or salt.  Get regular exercise. This is one of the most important things you can do for your health.  Most adults should exercise for at least 150 minutes each week. The exercise should increase your heart rate and make you sweat (moderate-intensity exercise).  Most adults should also do strengthening exercises at least twice a week. This is in addition to the moderate-intensity exercise.  Maintain a healthy weight  Body mass index (BMI) is a measurement that can be used to identify possible weight problems. It estimates body fat based on height and weight. Your health care provider can help determine your BMI and help you achieve or maintain a healthy weight.  For females 69 years of age and older:   A BMI below 18.5 is considered underweight.  A BMI of 18.5 to 24.9 is normal.  A BMI of 25 to 29.9 is  considered overweight.  A BMI of 30 and above is considered obese.  Watch levels of cholesterol and blood lipids  You should start having your blood tested for lipids and cholesterol at 70 years of age, then have this test every 5 years.  You may need to have your cholesterol levels checked more often if:  Your lipid or cholesterol levels are high.  You are older than 70 years of age.  You are at high risk for heart disease.  CANCER SCREENING   Lung Cancer  Lung cancer screening is recommended for adults 30-82 years old who are at high risk for lung cancer because of a history of smoking.  A yearly low-dose CT scan of the lungs is recommended for people who:  Currently smoke.  Have quit within the past 15 years.  Have at least a 30-pack-year history of smoking. A pack year is smoking an average of one pack of cigarettes a day for 1 year.  Yearly screening should continue until it has been 15 years since you quit.  Yearly screening should stop if you develop a health problem that would prevent you from having lung cancer treatment.  Breast Cancer  Practice breast self-awareness. This means understanding how your breasts normally appear and feel.  It also means doing regular breast self-exams. Let your health care provider know about any changes, no matter how small.  If you are in your 20s or 30s, you should have a clinical breast exam (  CBE) by a health care provider every 1-3 years as part of a regular health exam.  If you are 56 or older, have a CBE every year. Also consider having a breast X-Hosterman (mammogram) every year.  If you have a family history of breast cancer, talk to your health care provider about genetic screening.  If you are at high risk for breast cancer, talk to your health care provider about having an MRI and a mammogram every year.  Breast cancer gene (BRCA) assessment is recommended for women who have family members with BRCA-related cancers.  BRCA-related cancers include:  Breast.  Ovarian.  Tubal.  Peritoneal cancers.  Results of the assessment will determine the need for genetic counseling and BRCA1 and BRCA2 testing. Cervical Cancer Routine pelvic examinations to screen for cervical cancer are no longer recommended for nonpregnant women who are considered low risk for cancer of the pelvic organs (ovaries, uterus, and vagina) and who do not have symptoms. A pelvic examination may be necessary if you have symptoms including those associated with pelvic infections. Ask your health care provider if a screening pelvic exam is right for you.   The Pap test is the screening test for cervical cancer for women who are considered at risk.  If you had a hysterectomy for a problem that was not cancer or a condition that could lead to cancer, then you no longer need Pap tests.  If you are older than 65 years, and you have had normal Pap tests for the past 10 years, you no longer need to have Pap tests.  If you have had past treatment for cervical cancer or a condition that could lead to cancer, you need Pap tests and screening for cancer for at least 20 years after your treatment.  If you no longer get a Pap test, assess your risk factors if they change (such as having a new sexual partner). This can affect whether you should start being screened again.  Some women have medical problems that increase their chance of getting cervical cancer. If this is the case for you, your health care provider may recommend more frequent screening and Pap tests.  The human papillomavirus (HPV) test is another test that may be used for cervical cancer screening. The HPV test looks for the virus that can cause cell changes in the cervix. The cells collected during the Pap test can be tested for HPV.  The HPV test can be used to screen women 39 years of age and older. Getting tested for HPV can extend the interval between normal Pap tests from three to  five years.  An HPV test also should be used to screen women of any age who have unclear Pap test results.  After 70 years of age, women should have HPV testing as often as Pap tests.  Colorectal Cancer  This type of cancer can be detected and often prevented.  Routine colorectal cancer screening usually begins at 70 years of age and continues through 70 years of age.  Your health care provider may recommend screening at an earlier age if you have risk factors for colon cancer.  Your health care provider may also recommend using home test kits to check for hidden blood in the stool.  A small camera at the end of a tube can be used to examine your colon directly (sigmoidoscopy or colonoscopy). This is done to check for the earliest forms of colorectal cancer.  Routine screening usually begins at age 77.  Direct examination of the colon should be repeated every 5-10 years through 70 years of age. However, you may need to be screened more often if early forms of precancerous polyps or small growths are found. Skin Cancer  Check your skin from head to toe regularly.  Tell your health care provider about any new moles or changes in moles, especially if there is a change in a mole's shape or color.  Also tell your health care provider if you have a mole that is larger than the size of a pencil eraser.  Always use sunscreen. Apply sunscreen liberally and repeatedly throughout the day.  Protect yourself by wearing long sleeves, pants, a wide-brimmed hat, and sunglasses whenever you are outside. HEART DISEASE, DIABETES, AND HIGH BLOOD PRESSURE   Have your blood pressure checked at least every 1-2 years. High blood pressure causes heart disease and increases the risk of stroke.  If you are between 76 years and 55 years old, ask your health care provider if you should take aspirin to prevent strokes.  Have regular diabetes screenings. This involves taking a blood sample to check your  fasting blood sugar level.  If you are at a normal weight and have a low risk for diabetes, have this test once every three years after 70 years of age.  If you are overweight and have a high risk for diabetes, consider being tested at a younger age or more often. PREVENTING INFECTION  Hepatitis B  If you have a higher risk for hepatitis B, you should be screened for this virus. You are considered at high risk for hepatitis B if:  You were born in a country where hepatitis B is common. Ask your health care provider which countries are considered high risk.  Your parents were born in a high-risk country, and you have not been immunized against hepatitis B (hepatitis B vaccine).  You have HIV or AIDS.  You use needles to inject street drugs.  You live with someone who has hepatitis B.  You have had sex with someone who has hepatitis B.  You get hemodialysis treatment.  You take certain medicines for conditions, including cancer, organ transplantation, and autoimmune conditions. Hepatitis C  Blood testing is recommended for:  Everyone born from [birth year] through 1966.  Anyone with known risk factors for hepatitis C. Sexually transmitted infections (STIs)  You should be screened for sexually transmitted infections (STIs) including gonorrhea and chlamydia if:  You are sexually active and are younger than 70 years of age.  You are older than 70 years of age and your health care provider tells you that you are at risk for this type of infection.  Your sexual activity has changed since you were last screened and you are at an increased risk for chlamydia or gonorrhea. Ask your health care provider if you are at risk.  If you do not have HIV, but are at risk, it may be recommended that you take a prescription medicine daily to prevent HIV infection. This is called pre-exposure prophylaxis (PrEP). You are considered at risk if:  You are sexually active and do not regularly use condoms or  know the HIV status of your partner(s).  You take drugs by injection.  You are sexually active with a partner who has HIV. Talk with your health care provider about whether you are at high risk of being infected with HIV. If you choose to begin PrEP, you should first be tested for HIV. You should then be  tested every 3 months for as long as you are taking PrEP.  PREGNANCY   If you are premenopausal and you may become pregnant, ask your health care provider about preconception counseling.  If you may become pregnant, take 400 to 800 micrograms (mcg) of folic acid every day.  If you want to prevent pregnancy, talk to your health care provider about birth control (contraception). OSTEOPOROSIS AND MENOPAUSE   Osteoporosis is a disease in which the bones lose minerals and strength with aging. This can result in serious bone fractures. Your risk for osteoporosis can be identified using a bone density scan.  If you are 25 years of age or older, or if you are at risk for osteoporosis and fractures, ask your health care provider if you should be screened.  Ask your health care provider whether you should take a calcium or vitamin D supplement to lower your risk for osteoporosis.  Menopause may have certain physical symptoms and risks.  Hormone replacement therapy may reduce some of these symptoms and risks. Talk to your health care provider about whether hormone replacement therapy is right for you.  HOME CARE INSTRUCTIONS   Schedule regular health, dental, and eye exams.  Stay current with your immunizations.   Do not use any tobacco products including cigarettes, chewing tobacco, or electronic cigarettes.  If you are pregnant, do not drink alcohol.  If you are breastfeeding, limit how much and how often you drink alcohol.  Limit alcohol intake to no more than 1 drink per day for nonpregnant women. One drink equals 12 ounces of beer, 5 ounces of wine, or 1 ounces of hard liquor.  Do  not use street drugs.  Do not share needles.  Ask your health care provider for help if you need support or information about quitting drugs.  Tell your health care provider if you often feel depressed.  Tell your health care provider if you have ever been abused or do not feel safe at home. Document Released: 09/03/2010 Document Revised: 07/05/2013 Document Reviewed: 01/20/2013 [facility] Patient Information 2015 [place], [place]. This information is not intended to replace advice given to you by your health care provider. Make sure you discuss any questions you have with your health care provider.

## 2015-02-06 NOTE — Progress Notes (Signed)
Kiara Ruiz [birth year] [id number]        69 y.o.  G1P1001  For breast and pelvic exam.  Past medical history,surgical history, problem list, medications, allergies, family history and social history were all reviewed and documented as reviewed in the EPIC chart.  ROS:  Performed with pertinent positives and negatives included in the history, assessment and plan.   Additional significant findings :  none   Exam: Kim Counsellor Vitals:   02/06/15 1515  BP: 122/76  Height: 5\' 2"  (1.575 m)  Weight: 158 lb (71.668 kg)   General appearance:  Normal affect, orientation and appearance. Skin: Grossly normal HEENT: Without gross lesions.  No cervical or supraclavicular adenopathy. Thyroid normal.  Lungs:  Clear without wheezing, rales or rhonchi Cardiac: RR, without RMG Abdominal:  Soft, nontender, without masses, guarding, rebound, organomegaly or hernia Breasts:  Examined lying and sitting without masses, retractions, discharge or axillary adenopathy. Pelvic:  Ext/BUS/vagina with atrophic changes  Cervix flush with the upper vagina with atrophic changes  Uterus axial, normal size, shape and contour, midline and mobile nontender   Adnexa  Without masses or tenderness    Anus and perineum  Normal   Rectovaginal  Normal sphincter tone without palpated masses or tenderness.    Assessment/Plan:  69 y.o. G59P1001 female for breast and pelvic exam.  1. Postmenopausal/atrophic changes. Patient without significant hot flushes, night sweats, vaginal dryness or any vaginal bleeding. Continue monitoring report any bleeding. 2. Osteopenia. DEXA 2014 T score -1.8 FRAX 4.6%/0.7% stable from prior DEXA. Schedule DEXA now at 2 year interval the patient will follow up for this. Increased calcium vitamin D reviewed. 3. Colonoscopy 2008 with reported repeat interval 10 years. 4. Mammography 05/2014. Continue with annual mammography. SBE monthly reviewed. 5. Pap smear 2016 negative. History of  ascus with negative high-risk HPV 2015. Pap smear done today. 6. Health maintenance. No routine lab work done as patient does this at her primary physician's office. Follow up 1 year, sooner as needed.   Anastasio Auerbach MD, 3:34 PM 02/06/2015

## 2015-02-06 NOTE — Addendum Note (Signed)
Addended by: Nelva Nay on: 02/06/2015 04:11 PM   Modules accepted: Orders

## 2015-02-09 LAB — CYTOLOGY - PAP

## 2015-02-16 ENCOUNTER — Other Ambulatory Visit: Payer: Self-pay | Admitting: Gynecology

## 2015-02-16 ENCOUNTER — Encounter: Payer: Self-pay | Admitting: Gynecology

## 2015-02-16 ENCOUNTER — Ambulatory Visit (INDEPENDENT_AMBULATORY_CARE_PROVIDER_SITE_OTHER): Payer: Medicare Other

## 2015-02-16 DIAGNOSIS — M899 Disorder of bone, unspecified: Secondary | ICD-10-CM

## 2015-02-16 DIAGNOSIS — M8580 Other specified disorders of bone density and structure, unspecified site: Secondary | ICD-10-CM

## 2015-02-16 DIAGNOSIS — M8589 Other specified disorders of bone density and structure, multiple sites: Secondary | ICD-10-CM

## 2015-05-01 ENCOUNTER — Other Ambulatory Visit: Payer: Self-pay

## 2015-05-01 DIAGNOSIS — Z1231 Encounter for screening mammogram for malignant neoplasm of breast: Secondary | ICD-10-CM

## 2015-06-02 ENCOUNTER — Ambulatory Visit: Payer: Medicare Other

## 2015-06-15 ENCOUNTER — Ambulatory Visit
Admission: RE | Admit: 2015-06-15 | Discharge: 2015-06-15 | Disposition: A | Discharge status: Home / Self Care | Payer: Medicare Other | Source: Ambulatory Visit

## 2015-06-15 DIAGNOSIS — Z1231 Encounter for screening mammogram for malignant neoplasm of breast: Secondary | ICD-10-CM

## 2015-06-16 ENCOUNTER — Other Ambulatory Visit: Payer: Self-pay | Admitting: Gynecology

## 2015-06-16 DIAGNOSIS — R928 Other abnormal and inconclusive findings on diagnostic imaging of breast: Secondary | ICD-10-CM

## 2015-06-20 ENCOUNTER — Ambulatory Visit
Admission: RE | Admit: 2015-06-20 | Discharge: 2015-06-20 | Disposition: A | Discharge status: Home / Self Care | Payer: Medicare Other | Source: Ambulatory Visit | Attending: Gynecology | Admitting: Gynecology

## 2015-06-20 ENCOUNTER — Other Ambulatory Visit: Payer: Self-pay | Admitting: Gynecology

## 2015-06-20 DIAGNOSIS — R928 Other abnormal and inconclusive findings on diagnostic imaging of breast: Secondary | ICD-10-CM

## 2015-06-29 ENCOUNTER — Other Ambulatory Visit: Payer: Self-pay | Admitting: Gynecology

## 2015-06-29 ENCOUNTER — Ambulatory Visit
Admission: RE | Admit: 2015-06-29 | Discharge: 2015-06-29 | Disposition: A | Discharge status: Home / Self Care | Payer: Medicare Other | Source: Ambulatory Visit | Attending: Gynecology | Admitting: Gynecology

## 2015-06-29 DIAGNOSIS — R928 Other abnormal and inconclusive findings on diagnostic imaging of breast: Secondary | ICD-10-CM

## 2015-07-03 ENCOUNTER — Telehealth: Payer: Self-pay | Admitting: *Deleted

## 2015-07-03 DIAGNOSIS — C50919 Malignant neoplasm of unspecified site of unspecified female breast: Secondary | ICD-10-CM

## 2015-07-03 NOTE — Telephone Encounter (Signed)
Kiara Ruiz called from breast center stating pt breast bx results showed breast cancer, recommended MRI breast with/without contrast placed at [place] imagining I placed order. [name] has tried to call the pt today both on mobile and home # and no answer. She will schedule the MRI for pt and try to call her again with the results.

## 2015-07-04 ENCOUNTER — Telehealth: Payer: Self-pay | Admitting: *Deleted

## 2015-07-04 ENCOUNTER — Encounter: Payer: Self-pay | Admitting: *Deleted

## 2015-07-04 DIAGNOSIS — C50512 Malignant neoplasm of lower-outer quadrant of left female breast: Secondary | ICD-10-CM

## 2015-07-04 HISTORY — DX: Malignant neoplasm of lower-outer quadrant of left female breast: C50.512

## 2015-07-04 NOTE — Telephone Encounter (Signed)
Confirmed [facility] for 07/12/15 at 0815 .  Instructions and contact information given.

## 2015-07-07 ENCOUNTER — Telehealth: Payer: Self-pay | Admitting: *Deleted

## 2015-07-07 NOTE — Telephone Encounter (Signed)
  Oncology Nurse Navigator Documentation    Navigator Encounter Type: Telephone (07/07/15 1000) Telephone: Lahoma Crocker Call;Appt Confirmation/Clarification (07/07/15 1000)                                        Time Spent with Patient: 15 (07/07/15 1000)

## 2015-07-10 ENCOUNTER — Ambulatory Visit
Admission: RE | Admit: 2015-07-10 | Discharge: 2015-07-10 | Disposition: A | Discharge status: Home / Self Care | Payer: Medicare Other | Source: Ambulatory Visit | Attending: Gynecology | Admitting: Gynecology

## 2015-07-10 DIAGNOSIS — C50919 Malignant neoplasm of unspecified site of unspecified female breast: Secondary | ICD-10-CM

## 2015-07-10 MED ORDER — GADOBENATE DIMEGLUMINE 529 MG/ML IV SOLN
15.0000 mL | Freq: Once | INTRAVENOUS | Status: AC | PRN
  Administered 2015-07-10: 15 mL via INTRAVENOUS

## 2015-07-12 ENCOUNTER — Ambulatory Visit: Payer: Self-pay | Admitting: Surgery

## 2015-07-12 ENCOUNTER — Ambulatory Visit: Payer: Medicare Other | Attending: Surgery | Admitting: Physical Therapy

## 2015-07-12 ENCOUNTER — Ambulatory Visit
Admission: RE | Admit: 2015-07-12 | Discharge: 2015-07-12 | Disposition: A | Discharge status: Home / Self Care | Payer: Medicare Other | Source: Ambulatory Visit | Attending: Radiation Oncology | Admitting: Radiation Oncology

## 2015-07-12 ENCOUNTER — Encounter: Payer: Self-pay | Admitting: Hematology and Oncology

## 2015-07-12 ENCOUNTER — Encounter: Payer: Self-pay | Admitting: *Deleted

## 2015-07-12 ENCOUNTER — Other Ambulatory Visit (HOSPITAL_BASED_OUTPATIENT_CLINIC_OR_DEPARTMENT_OTHER): Payer: Medicare Other

## 2015-07-12 ENCOUNTER — Ambulatory Visit (HOSPITAL_BASED_OUTPATIENT_CLINIC_OR_DEPARTMENT_OTHER): Payer: Medicare Other | Admitting: Hematology and Oncology

## 2015-07-12 VITALS — BP 146/81 | HR 86 | Temp 98.1°F | Resp 18 | Ht 62.0 in | Wt 158.0 lb

## 2015-07-12 DIAGNOSIS — Z800 Family history of malignant neoplasm of digestive organs: Secondary | ICD-10-CM

## 2015-07-12 DIAGNOSIS — C50512 Malignant neoplasm of lower-outer quadrant of left female breast: Secondary | ICD-10-CM | POA: Diagnosis not present

## 2015-07-12 DIAGNOSIS — C50412 Malignant neoplasm of upper-outer quadrant of left female breast: Secondary | ICD-10-CM | POA: Diagnosis not present

## 2015-07-12 DIAGNOSIS — R293 Abnormal posture: Secondary | ICD-10-CM | POA: Diagnosis present

## 2015-07-12 DIAGNOSIS — C50912 Malignant neoplasm of unspecified site of left female breast: Secondary | ICD-10-CM

## 2015-07-12 LAB — CBC WITH DIFFERENTIAL/PLATELET
BASO%: 0.5 % (ref 0.0–2.0)
Basophils Absolute: 0 10*3/uL (ref 0.0–0.1)
EOS ABS: 0.2 10*3/uL (ref 0.0–0.5)
EOS%: 3.8 % (ref 0.0–7.0)
HCT: 42.6 % (ref 34.8–46.6)
HEMOGLOBIN: 13.7 g/dL (ref 11.6–15.9)
LYMPH%: 27.3 % (ref 14.0–49.7)
MCH: 30.4 pg (ref 25.1–34.0)
MCHC: 32.2 g/dL (ref 31.5–36.0)
MCV: 94.5 fL (ref 79.5–101.0)
MONO#: 0.6 10*3/uL (ref 0.1–0.9)
MONO%: 9.2 % (ref 0.0–14.0)
NEUT#: 3.7 10*3/uL (ref 1.5–6.5)
NEUT%: 59.2 % (ref 38.4–76.8)
PLATELETS: 222 10*3/uL (ref 145–400)
RBC: 4.51 10*6/uL (ref 3.70–5.45)
RDW: 12.7 % (ref 11.2–14.5)
WBC: 6.3 10*3/uL (ref 3.9–10.3)
lymph#: 1.7 10*3/uL (ref 0.9–3.3)

## 2015-07-12 LAB — COMPREHENSIVE METABOLIC PANEL
ALT: 13 U/L (ref 0–55)
AST: 15 U/L (ref 5–34)
Albumin: 3.9 g/dL (ref 3.5–5.0)
Alkaline Phosphatase: 73 U/L (ref 40–150)
Anion Gap: 9 mEq/L (ref 3–11)
BUN: 11.2 mg/dL (ref 7.0–26.0)
CHLORIDE: 105 meq/L (ref 98–109)
CO2: 28 mEq/L (ref 22–29)
Calcium: 9.7 mg/dL (ref 8.4–10.4)
Creatinine: 0.9 mg/dL (ref 0.6–1.1)
EGFR: 73 mL/min/{1.73_m2} — ABNORMAL LOW (ref >90)
Glucose: 82 mg/dl (ref 70–140)
POTASSIUM: 4.4 meq/L (ref 3.5–5.1)
Sodium: 142 mEq/L (ref 136–145)
Total Bilirubin: 0.5 mg/dL (ref 0.20–1.20)
Total Protein: 7.5 g/dL (ref 6.4–8.3)

## 2015-07-12 NOTE — Progress Notes (Signed)
[name] CONSULT NOTE  Patient Care Team: Lucianne Lei, MD as PCP - General (Family Medicine)  CHIEF COMPLAINTS/PURPOSE OF CONSULTATION:  Newly diagnosed breast cancer  HISTORY OF PRESENTING ILLNESS:  Kiara Ruiz 70 y.o. female is here because of recent diagnosis of left breast cancer. She had a screening mammogram that showed a distortion in both her breasts. The right breast on the previous surgical scar with a previous benign biopsy. Left breast lower outer quadrant confirm biopsy revealed invasive lobular cancer grade 1 that was ER positive PR negative HER-2 negative with a Ki-67 15%. There was no ultrasound correlation for this mass. Subsequently she underwent a breast MRI that revealed mass and non-mass enhancement measuring 4.3 cm. She is here today after being presented and discussed the multidisciplinary tumor board.  I reviewed her records extensively and collaborated the history with the patient.  SUMMARY OF ONCOLOGIC HISTORY:   Breast cancer of lower-outer quadrant of left female breast ([place])   06/29/2015 Initial Diagnosis Left breast biopsy LOQ: invasive lobular carcinoma, grade 1, ER 80%, PR 0%, Ki-67 15%, HER-2 negative ratio 1.24; screening detected distortion without ultrasound correlate    07/10/2015 Breast MRI Left breast posterior lower outer quadrant: Irregular mass and NME 4.3 x 2.3 x 2.2 cm, no lymph nodes, T2 N0 stage II a clinical stage   MEDICAL HISTORY:  Past Medical History  Diagnosis Date  . Osteopenia 02/2015    T score -2.0 FRAX 10%/1.8%  . Glaucoma   . ASCUS favor benign 09/2013    Negative HR HPV  . Breast cancer of lower-outer quadrant of left female breast ([place]) 07/04/2015  . Breast cancer [facility])     SURGICAL HISTORY: Past Surgical History  Procedure Laterality Date  . Breast surgery      BREAST BIOPSIES X 3 (BENIGN)  . Tonsillectomy    . Tubal ligation    . Eye surgery    . Cataract extraction      left eye    SOCIAL  HISTORY: Social History   Social History  . Marital Status: Married    Spouse Name: N/A  . Number of Children: N/A  . Years of Education: N/A   Occupational History  . Not on file.   Social History Main Topics  . Smoking status: Never Smoker   . Smokeless tobacco: Never Used  . Alcohol Use: No  . Drug Use: No  . Sexual Activity: Yes    Birth Control/ Protection: Surgical, Post-menopausal     Comment: BTL-1st intercourse 70 yo-Fewer than 5 partners   Other Topics Concern  . Not on file   Social History Narrative    FAMILY HISTORY: Family History  Problem Relation Age of Onset  . Diabetes Mother   . Cancer Mother 67    PANCREATIC  . Hypertension Father   . Diabetes Brother   . Diabetes Brother     ALLERGIES:  has No Known Allergies.  MEDICATIONS:  Current Outpatient Prescriptions  Medication Sig Dispense Refill  . aspirin 81 MG tablet Take 81 mg by mouth daily.    Marland Kitchen CALCIUM-VITAMIN D PO Take 1 tablet by mouth daily.     Marland Kitchen latanoprost (XALATAN) 0.005 % ophthalmic solution 1 drop at bedtime.      . Multiple Vitamin (MULTIVITAMIN PO) Take 1 tablet by mouth daily.     . timolol (BETIMOL) 0.5 % ophthalmic solution Place 1 drop into both eyes 2 (two) times daily.      No  current facility-administered medications for this visit.    REVIEW OF SYSTEMS:   Constitutional: Denies fevers, chills or abnormal night sweats Eyes: Denies blurriness of vision, double vision or watery eyes Ears, nose, mouth, throat, and face: Denies mucositis or sore throat Respiratory: Denies cough, dyspnea or wheezes Cardiovascular: Denies palpitation, chest discomfort or lower extremity swelling Gastrointestinal:  Denies nausea, heartburn or change in bowel habits Skin: Denies abnormal skin rashes Lymphatics: Denies new lymphadenopathy or easy bruising Neurological:Denies numbness, tingling or new weaknesses Behavioral/Psych: Mood is stable, no new changes  Breast:  Denies any palpable  lumps or discharge All other systems were reviewed with the patient and are negative.  PHYSICAL EXAMINATION: ECOG PERFORMANCE STATUS: 0 - Asymptomatic  Filed Vitals:   07/12/15 0855  BP: 146/81  Pulse: 86  Temp: 98.1 F (36.7 C)  Resp: 18   Filed Weights   07/12/15 0855  Weight: 158 lb (71.668 kg)    GENERAL:alert, no distress and comfortable SKIN: skin color, texture, turgor are normal, no rashes or significant lesions EYES: normal, conjunctiva are pink and non-injected, sclera clear OROPHARYNX:no exudate, no erythema and lips, buccal mucosa, and tongue normal  NECK: supple, thyroid normal size, non-tender, without nodularity LYMPH:  no palpable lymphadenopathy in the cervical, axillary or inguinal LUNGS: clear to auscultation and percussion with normal breathing effort HEART: regular rate & rhythm and no murmurs and no lower extremity edema ABDOMEN:abdomen soft, non-tender and normal bowel sounds Musculoskeletal:no cyanosis of digits and no clubbing  PSYCH: alert & oriented x 3 with fluent speech NEURO: no focal motor/sensory deficits BREAST: No palpable nodules in breast. No palpable axillary or supraclavicular lymphadenopathy (exam performed in the presence of a chaperone)   LABORATORY DATA:  I have reviewed the data as listed Lab Results  Component Value Date   WBC 6.3 07/12/2015   HGB 13.7 07/12/2015   HCT 42.6 07/12/2015   MCV 94.5 07/12/2015   PLT 222 07/12/2015   Lab Results  Component Value Date   NA 142 07/12/2015   K 4.4 07/12/2015   CO2 28 07/12/2015    RADIOGRAPHIC STUDIES: I have personally reviewed the radiological reports and agreed with the findings in the report.  ASSESSMENT AND PLAN:  Breast cancer of lower-outer quadrant of left female breast ([place]) Left breast biopsy 06/26/2015 LOQ: invasive lobular carcinoma, grade 1, ER 80%, PR 0%, Ki-67 15%, HER-2 negative ratio 1.24; screening detected distortion without ultrasound correlate. Breast MRI  07/10/2015: Left breast posterior lower outer quadrant: Irregular mass and NME 4.3 x 2.3 x 2.2 cm, no lymph nodes, T2 N0 stage II a clinical stage   Pathology and radiology counseling:Discussed with the patient, the details of pathology including the type of breast cancer,the clinical staging, the significance of ER, PR and HER-2/neu receptors and the implications for treatment. After reviewing the pathology in detail, we proceeded to discuss the different treatment options between surgery, radiation, chemotherapy, antiestrogen therapies.  Recommendations: 1. Breast conserving surgery followed by 2. Oncotype DX testing to determine chemotherapy benefit 3. Adjuvant radiation therapy followed by 4. Adjuvant antiestrogen therapy  Return to clinic after surgery to discuss the final pathology report  All questions were answered. The patient knows to call the clinic with any problems, questions or concerns.    Rulon Eisenmenger, MD 07/12/2015

## 2015-07-12 NOTE — Patient Instructions (Signed)

## 2015-07-12 NOTE — Progress Notes (Signed)
[facility] Psychosocial Distress Screening Clinical Social Work  Clinical Social Work met with pt at [facility] to introduce self, review role of Pt and and Family Support Team and to review the distress screening protocol. Clinical Social Work reviewed the distress screening protocol.  The patient scored a 0 on the Psychosocial Distress Thermometer which indicates no distress. Clinical Social Worker met with pt to assess for distress and other psychosocial needs. 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 She feels very confident in her treatment plan and is ok with her plan. CSW reviewed common emotions/coping techniques and educated pt that distress level can change at points in treatment. CSW encouraged pt to reach out as needed and reviewed resources for additional support available as needed.   ONCBCN DISTRESS SCREENING 07/12/2015  Screening Type Initial Screening  Distress experienced in past week (1-10) 0  Referral to support programs Yes    Clinical Social Worker follow up needed: No.  If yes, follow up plan: Loren Racer, [place]  [facility] Phone: [phone] Fax: [phone]

## 2015-07-12 NOTE — H&P (Signed)
Kiara Ruiz 07/12/2015 8:00 AM Location: [facility] Patient #: [id number] DOB: 06/18/1945 Undefined / Language: Kiara Ruiz / Race: Black or African American Female  History of Present Illness Kiara Ruiz A. Kiara Buczkowski MD; 07/12/2015 11:09 AM) Patient words: [place] DR. Pablo Ledger after screening mammogram showed an abnormality in her left central deep breast. Core biopsy was done which showed invasive lobular carcinoma ER positive, PR negative HER-2/neu negative K I 67 of 15% MRI was done which showed a 4.3 cm area of enhancement in the central deep left breast. Patient had previous right breast biopsy many years ago which was negative. Denies problem with her breast , nipple discharge or the problem with her breast.  The patient is a 70 year old female.   Other Problems Kiara Slipper, RN; 07/12/2015 8:01 AM) Back Pain Breast Cancer Lump In Breast  Past Surgical History Kiara Slipper, RN; 07/12/2015 8:01 AM) Breast Mass; Local Excision Right. Cataract Surgery Left. Cesarean Section - Multiple  Diagnostic Studies History Kiara Slipper, RN; 07/12/2015 8:01 AM) Colonoscopy 1-5 years ago Mammogram within last year Pap Smear 1-5 years ago  Medication History Kiara Slipper, RN; 07/12/2015 8:01 AM) Medications Reconciled  Social History Kiara Slipper, RN; 07/12/2015 8:01 AM) Caffeine use Carbonated beverages, Coffee, Tea. No alcohol use No drug use Tobacco use Never smoker.  Family History Kiara Slipper, RN; 07/12/2015 8:01 AM) Diabetes Mellitus Father. Hypertension Father, Mother. Malignant Neoplasm Of Pancreas Mother.  Pregnancy / Birth History Kiara Slipper, RN; 07/12/2015 8:01 AM) Age at menarche 44 years. Gravida 1 Maternal age 69-30 Para 1     Review of Systems Kiara Slipper RN; 07/12/2015 8:01 AM) General Not Present- Appetite Loss, Chills, Fatigue, Fever, Night Sweats, Weight Gain and Weight Loss. Skin Not Present- Change in  Wart/Mole, Dryness, Hives, Jaundice, New Lesions, Non-Healing Wounds, Rash and Ulcer. HEENT Present- Seasonal Allergies and Wears glasses/contact lenses. Not Present- Earache, Hearing Loss, Hoarseness, Nose Bleed, Oral Ulcers, Ringing in the Ears, Sinus Pain, Sore Throat, Visual Disturbances and Yellow Eyes. Respiratory Present- Snoring. Not Present- Bloody sputum, Chronic Cough, Difficulty Breathing and Wheezing. Cardiovascular Not Present- Chest Pain, Difficulty Breathing Lying Down, Leg Cramps, Palpitations, Rapid Heart Rate, Shortness of Breath and Swelling of Extremities. Gastrointestinal Present- Indigestion. Not Present- Abdominal Pain, Bloating, Bloody Stool, Change in Bowel Habits, Chronic diarrhea, Constipation, Difficulty Swallowing, Excessive gas, Gets full quickly at meals, Hemorrhoids, Nausea, Rectal Pain and Vomiting. Female Genitourinary Not Present- Frequency, Nocturia, Painful Urination, Pelvic Pain and Urgency. Musculoskeletal Present- Back Pain and Joint Stiffness. Not Present- Joint Pain, Muscle Pain, Muscle Weakness and Swelling of Extremities. Hematology Not Present- Easy Bruising, Excessive bleeding, Gland problems, HIV and Persistent Infections.   Physical Exam (Kiara Ruiz A. Kiara Andreoni MD; 07/12/2015 11:10 AM)  General Mental Status-Alert. General Appearance-Consistent with stated age. Hydration-Well hydrated. Voice-Normal.  Head and Neck Head-normocephalic, atraumatic with no lesions or palpable masses. Trachea-midline. Thyroid Gland Characteristics - normal size and consistency.  Chest and Lung Exam Chest and lung exam reveals -quiet, even and easy respiratory effort with no use of accessory muscles and on auscultation, normal breath sounds, no adventitious sounds and normal vocal resonance. Inspection Chest Wall - Normal. Back - normal.  Breast Breast - Left-Symmetric, Non Tender, No Biopsy scars, no Dimpling, No Inflammation, No Lumpectomy scars, No  Mastectomy scars, No Peau d' Orange. Breast - Right-Symmetric, Non Tender, No Biopsy scars, no Dimpling, No Inflammation, No Lumpectomy scars, No Mastectomy scars, No Peau d' Orange. Breast Lump-No Palpable Breast Mass. Note:  left breast bruising  Cardiovascular Cardiovascular examination reveals -normal heart sounds, regular rate and rhythm with no murmurs and normal pedal pulses bilaterally.  Neurologic Neurologic evaluation reveals -alert and oriented x 3 with no impairment of recent or remote memory. Mental Status-Normal.  Musculoskeletal Normal Exam - Left-Upper Extremity Strength Normal and Lower Extremity Strength Normal. Normal Exam - Right-Upper Extremity Strength Normal and Lower Extremity Strength Normal.  Lymphatic Head & Neck  General Head & Neck Lymphatics: Bilateral - Description - Normal. Axillary  General Axillary Region: Bilateral - Description - Normal. Tenderness - Non Tender.    Assessment & Plan (Kiara Ruiz A. Kiara Staton MD; 07/12/2015 11:15 AM)  BREAST CANCER, LEFT (C50.912) Impression: discussed breast conservation with sentinel lymph.node mapping versus mastectomy and reconstruction. Theartial mastectomy with left axillary sentinel lymph node mapping.my with left axillary sentinel lymph node mapping. Risk of sentinel lymph node mapping include bleeding, infection, lymphedema, shoulder pain. stiffness, dye allergy. cosmetic deformity , blood clots, death, need for more surgery. Pt agres to proceed. Risk of lumpectomy include bleeding, infection, seroma, more surgery, use of seed/wire, wound care, cosmetic deformity and the need for other treatments, death , blood clots, death. Pt agrees to proceed.  Current Plans You are being scheduled for surgery - Our schedulers will call you.  You should hear from our office's scheduling department within 5 working days about the location, date, and time of surgery. We try to make accommodations for patient's  preferences in scheduling surgery, but sometimes the OR schedule or the surgeon's schedule prevents [place] from making those accommodations.  If you have not heard from our office [phone]) in 5 working days, call the office and ask for your surgeon's nurse.  If you have other questions about your diagnosis, plan, or surgery, call the office and ask for your surgeon's nurse.  Pt Education - [facility] Breast Cancer Information Given - [facility] "Breast Journey" Package We discussed the staging and pathophysiology of breast cancer. We discussed all of the different options for treatment for breast cancer including surgery, chemotherapy, radiation therapy, Herceptin, and antiestrogen therapy. We discussed a sentinel lymph node biopsy as she does not appear to having lymph node involvement right now. We discussed the performance of that with injection of radioactive tracer and blue dye. We discussed that she would have an incision underneath her axillary hairline. We discussed that there is a bout a 10-20% chance of having a positive node with a sentinel lymph node biopsy and we will await the permanent pathology to make any other first further decisions in terms of her treatment. One of these options might be to return to the operating room to perform an axillary lymph node dissection. We discussed about a 1-2% risk lifetime of chronic shoulder pain as well as lymphedema associated with a sentinel lymph node biopsy. We discussed the options for treatment of the breast cancer which included lumpectomy versus a mastectomy. We discussed the performance of the lumpectomy with a wire placement. We discussed a 10-20% chance of a positive margin requiring reexcision in the operating room. We also discussed that she may need radiation therapy or antiestrogen therapy or both if she undergoes lumpectomy. We discussed the mastectomy and the postoperative care for that as well. We discussed that there is no difference in her  survival whether she undergoes lumpectomy with radiation therapy or antiestrogen therapy versus a mastectomy. There is a slight difference in the local recurrence rate being 3-5% with lumpectomy and about 1% with a mastectomy. We discussed  the risks of operation including bleeding, infection, possible reoperation. She understands her further therapy will be based on what her stages at the time of her operation.  Pt Education - [facility] breast cancer surgery: discussed with patient and provided information. Pt Education - [facility] Breast Biopsy HCI: discussed with patient and provided information.

## 2015-07-12 NOTE — Progress Notes (Signed)
Radiation Oncology         [phone]) [phone] ________________________________  Initial Outpatient Consultation - Date: 07/12/2015   Name: Kiara Ruiz MRN: [id number]   DOB: [birth year]  REFERRING PHYSICIAN: Erroll Luna, MD  DIAGNOSIS AND STAGE: T2N0 invasive lobular carcinoma of the left breast  HISTORY OF PRESENT ILLNESS::Kiara Ruiz is a 70 y.o. female who had a screening mammogram on 06/15/15. This was breast density category D (extremely dense breast tissue) and it noted a possible distortion in the left and right breasts. Mammogram on 06/20/15 noted two sites of distortion in the right breast with one immediately deep to the medial aspect of the nipple and the other slightly more medially. In the lateral posterior depth of the left breast, there was a possible distortion and a possible associated obscured mass. Ultrasound targeted to the left breast demonstrated extremely dense fibroglandular tissue, multiple scattered and confluent areas of shadowing, and not definite masses were identified. Biopsy of the LOQ of the left breast on 06/29/15 showed low grade invasive lobular carcinoma (ER positive, PR negative, HER2 negative, Ki67 15%). MRI of the bilateral breasts on 07/10/15 showed an irregular mass and non-mass enhancement measuring 4.3 x 2.3 x 2.2 cm in the LOQ of the left breast. There were no abnormal appearing lymph nodes and no suspicious enhancement within the right breast.  She is present with her husband today. She is retired from the fashion industry creating patterns for clothing.   PREVIOUS RADIATION THERAPY: No  Past medical, social and family history were reviewed in the electronic chart. Review of symptoms was reviewed in the electronic chart. Medications were reviewed in the electronic chart.   Gynecologic History  Age at first menstrual period? 11  Are you still having periods? No  If you no longer have periods: Have you used hormone replacement? Yes  If YES, for how  long? ~ 10 years When did you stop? ~ age 66 Obstetric History:  How many children have you carried to term? 1 Your age at first live birth? 34  Pregnant now or trying to get pregnant? No  Have you used birth control pills or hormone shots for contraception? Yes  If so, for how long (or approximate dates)? 1 year, ~ Isla Pence  Would you be interested in learning more about the options to preserve fertility? No Health Maintenance:  Have you ever had a colonoscopy? Yes If yes, date? 2011  Have you ever had a bone density? Yes If yes, date? 2016  Date of your last PAP smear? 2015 Date of your FIRST mammogram? 1970  The patient has a medical history of wearing glasses/contacts and back pain.  PHYSICAL EXAM: Vitals with BMI 07/12/2015  Height _0   Weight 158 lbs  BMI 29  Systolic 784  Diastolic 81  Pulse 86  Respirations 18   Alert and oriented x 3. In no acute distress. Good range of motion in the upper extremities. She has a palpable 4 cm mass in the posterior aspect of the left breast. No palpable abnormalities of the right breast. No palpable cervical, supraclavicular, or axillary adenopathy.  IMPRESSION: T2N0 invasive lobular carcinoma of the left breast  PLAN: I spoke to the patient today regarding her diagnosis and options for treatment. We discussed the equivalence in terms of survival and local failure between mastectomy and breast conservation. We discussed the role of radiation in decreasing local failures in patients who undergo lumpectomy. We discussed the process of CT simulation and the placement  tattoos. We discussed 6 weeks of treatment as an outpatient. We discussed the possibility of asymptomatic lung damage. We discussed the low likelihood of secondary malignancies. We discussed the possible side effects including but not limited to skin redness, fatigue, permanent skin darkening, and breast swelling. I will see her back after her Oncotype score. I did clarify with her that if  she needed chemotherapy this would be performed prior to radiation. She met with medical oncology as well as a member of our patient family support team and our physical therapist. I will plan on seeing her back after her surgery. We will use breath-hold technique for cardiac sparring if needed although with her breast size, we may need to use prone technique.   We also discussed breast reductions but she is not interested at her age, she states.  I spent 40 minutes  face to face with the patient and more than 50% of that time was spent in counseling and/or coordination of care.   ------------------------------------------------  Thea Silversmith, MD  This document serves as a record of services personally performed by Thea Silversmith, MD. It was created on her behalf by Darcus Austin, a trained medical scribe. The creation of this record is based on the scribe's personal observations and the provider's statements to them. This document has been checked and approved by the attending provider.

## 2015-07-12 NOTE — Assessment & Plan Note (Signed)
Left breast biopsy 06/26/2015 LOQ: invasive lobular carcinoma, grade 1, ER 80%, PR 0%, Ki-67 15%, HER-2 negative ratio 1.24; screening detected distortion without ultrasound correlate. Breast MRI 07/10/2015: Left breast posterior lower outer quadrant: Irregular mass and NME 4.3 x 2.3 x 2.2 cm, no lymph nodes, T2 N0 stage II a clinical stage   Pathology and radiology counseling:Discussed with the patient, the details of pathology including the type of breast cancer,the clinical staging, the significance of ER, PR and HER-2/neu receptors and the implications for treatment. After reviewing the pathology in detail, we proceeded to discuss the different treatment options between surgery, radiation, chemotherapy, antiestrogen therapies.  Recommendations: 1. Breast conserving surgery followed by 2. Adjuvant radiation therapy followed by 3. Adjuvant antiestrogen therapy  Return to clinic after surgery to discuss the final pathology report

## 2015-07-12 NOTE — Therapy (Signed)
[place], [place], [address] Phone: [phone]   Fax:  [phone]  Physical Therapy Evaluation  Patient Details  Name: Kiara Ruiz MRN: [id number] Date of Birth: 05-19-1945 Referring Provider: Dr. Erroll Luna  Encounter Date: 07/12/2015      PT End of Session - 07/12/15 1122    Visit Number 1   Number of Visits 1   PT Start Time 0935   PT Stop Time 1003   PT Time Calculation (min) 28 min   Activity Tolerance Patient tolerated treatment well   Behavior During Therapy [facility] for tasks assessed/performed      Past Medical History  Diagnosis Date  . Osteopenia 02/2015    T score -2.0 FRAX 10%/1.8%  . Glaucoma   . ASCUS favor benign 09/2013    Negative HR HPV  . Breast cancer of lower-outer quadrant of left female breast ([place]) 07/04/2015  . Breast cancer [facility])     Past Surgical History  Procedure Laterality Date  . Breast surgery      BREAST BIOPSIES X 3 (BENIGN)  . Tonsillectomy    . Tubal ligation    . Eye surgery    . Cataract extraction      left eye    There were no vitals filed for this visit.       Subjective Assessment - 07/12/15 1114    Subjective Patient reports she is here today to be seen by her medical team for her newly diagnosed left breast cancer.   Patient is accompained by: Family member   Pertinent History Patient was diagnosed on 06/30/15 with left invasive lobular carcinoma breast cancer measuring 4.3 cm in the upper outer quadrant.  It is ER/PR positive and HER2 negative with a Ki67 of 15%.   Patient Stated Goals reduce lymphedema risk and learn post op shoulder ROM HEP   Currently in Pain? Yes   Pain Score 3    Pain Location Back   Pain Orientation Lower   Pain Descriptors / Indicators Aching   Pain Type Chronic pain   Pain Onset More than a month ago   Pain Frequency Intermittent   Aggravating Factors  Increased pain upon waking   Pain Relieving  Factors Reduces throughout the day   Multiple Pain Sites No            [facility] PT Assessment - 07/12/15 0001    Assessment   Medical Diagnosis Left breast cancer   Referring Provider Dr. Marcello Moores [place]   Onset Date/Surgical Date 06/30/15   Hand Dominance Right   Prior Therapy none   Precautions   Precautions Other (comment)   Precaution Comments active breast cancer   Restrictions   Weight Bearing Restrictions No   Balance Screen   Has the patient fallen in the past 6 months No   Has the patient had a decrease in activity level because of a fear of falling?  No   Is the patient reluctant to leave their home because of a fear of falling?  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 [employer] She does 60 minutes of cardio 3x/week and has a Engineer, maintenance  Status Within Functional Limits for tasks assessed   Posture/Postural Control   Posture/Postural Control Postural limitations   Postural Limitations Rounded Shoulders;Forward head   Posture Comments Left clavicle sits anterior to right with bony prominence   ROM / Strength   AROM / PROM / Strength AROM;Strength   AROM   AROM Assessment Site Shoulder;Cervical   Right/Left Shoulder Right;Left   Right Shoulder Extension 65 Degrees   Right Shoulder Flexion 146 Degrees   Right Shoulder ABduction 144 Degrees   Right Shoulder Internal Rotation 73 Degrees   Right Shoulder External Rotation 89 Degrees   Left Shoulder Extension 63 Degrees   Left Shoulder Flexion 151 Degrees   Left Shoulder ABduction 145 Degrees   Left Shoulder Internal Rotation 79 Degrees   Left Shoulder External Rotation 75 Degrees   Cervical Flexion WNL    Cervical Extension 25% limited    Cervical - Right Side Bend 25% limited    Cervical - Left Side Bend 25% limited    Cervical - Right Rotation 25% limited    Cervical - Left Rotation 25% limited    Strength   Overall Strength Within functional limits for tasks performed           LYMPHEDEMA/ONCOLOGY QUESTIONNAIRE - 07/12/15 1119    Type   Cancer Type Left breast cancer   Lymphedema Assessments   Lymphedema Assessments Upper extremities   Right Upper Extremity Lymphedema   10 cm Proximal to Olecranon Process 28.1 cm   Olecranon Process 23.3 cm   10 cm Proximal to Ulnar Styloid Process 20.6 cm   Just Proximal to Ulnar Styloid Process 14.9 cm   Across Hand at [employer] 19 cm   At [place] of 2nd Digit 6.4 cm   Left Upper Extremity Lymphedema   10 cm Proximal to Olecranon Process 28.5 cm   Olecranon Process 23.8 cm   10 cm Proximal to Ulnar Styloid Process 19.6 cm   Just Proximal to Ulnar Styloid Process 14.9 cm   Across Hand at [employer] 18.6 cm   At [place] of 2nd Digit 6.1 cm       Patient was instructed today in a home exercise program today for post op shoulder range of motion. These included active assist shoulder flexion in sitting, scapular retraction, wall walking with shoulder abduction, and hands behind head external rotation.  She was encouraged to do these twice a day, holding 3 seconds and repeating 5 times when permitted by her physician.         PT Education - 07/12/15 1122    Education provided Yes   Education Details Lymphedema risk reduction and post op shoulder ROM HEP   Person(s) Educated Patient;Spouse   Methods Explanation;Demonstration;Handout   Comprehension Verbalized understanding;Returned demonstration              Breast Clinic Goals - 07/12/15 1128    Patient will be able to verbalize understanding of pertinent lymphedema risk reduction practices relevant to her diagnosis specifically related to skin care.   Time 1   Period  Days   Status Achieved   Patient will be able to return demonstrate and/or verbalize understanding of the post-op home exercise program related to regaining shoulder range of motion.   Time 1   Period Days   Status Achieved   Patient will be able to verbalize understanding of the importance of attending the postoperative After Breast Cancer Class for further lymphedema risk reduction education and therapeutic exercise.  Time 1   Period Days   Status Achieved              Plan - 07/18/2015 1123    Clinical Impression Statement Patient was diagnosed on 06/30/15 with left invasive lobular carcinoma breast cancer measuring 4.3 cm in the upper outer quadrant.  It is ER/PR positive and HER2 negative with a Ki67 of 15%.  She is planning to have a left lumpectomy with a sentinel node biopsy followed by radiation and anti-estrogen therapy.  She may benefit from post op PT to regain ROM and reduce lymphedema risk.  Her condition is considered to be evolving due to the recent diagnosis and her upcoming surgery.  She has no comorbidities that should interfere with rehab.   Rehab Potential Excellent   Clinical Impairments Affecting Rehab Potential none   PT Frequency One time visit   PT Treatment/Interventions Therapeutic exercise;Patient/family education   PT Next Visit Plan Follow up after surgery   PT Home Exercise Plan Post op shoulder ROM HEP   Consulted and Agree with Plan of Care Patient;Family member/caregiver   Family Member Consulted husband      Patient will benefit from skilled therapeutic intervention in order to improve the following deficits and impairments:  Decreased strength, Decreased knowledge of precautions, Pain, Impaired UE functional use, Decreased range of motion  Visit Diagnosis: Carcinoma of upper-outer quadrant of left female breast ([place]) - Plan: PT plan of care cert/re-cert  Abnormal posture - Plan: PT plan of care cert/re-cert  Patient will follow up at  outpatient cancer rehab if needed following surgery.  If the patient requires physical therapy at that time, a specific plan will be dictated and sent to the referring physician for approval. The patient was educated today on appropriate basic range of motion exercises to begin post operatively and the importance of attending the After Breast Cancer class following surgery.  Patient was educated today on lymphedema risk reduction practices as it pertains to recommendations that will benefit the patient immediately following surgery.  She verbalized good understanding.  No additional physical therapy is indicated at this time.        G-Codes - 18-Jul-2015 1156    Functional Assessment Tool Used clinical judgement   Functional Limitation Other PT primary   Other PT Primary Current Status (U8828) At least 1 percent but less than 20 percent impaired, limited or restricted   Other PT Primary Goal Status (M0349) At least 1 percent but less than 20 percent impaired, limited or restricted   Other PT Primary Discharge Status (Z7915) At least 1 percent but less than 20 percent impaired, limited or restricted       Problem List Patient Active Problem List   Diagnosis Date Noted  . Breast cancer of lower-outer quadrant of left female breast ([place]) 07/04/2015  . Osteopenia   . Glaucoma     Annia Friendly, [place] Jul 18, 2015 11:58 AM  [place], [place], [address] Phone: [phone]   Fax:  [phone]  Name: Kiara Ruiz MRN: [id number] Date of Birth: 11-03-1945

## 2015-07-12 NOTE — Progress Notes (Signed)
Note created by Dr. Gudena during office visit, copy to patient,original to scan. 

## 2015-07-17 ENCOUNTER — Telehealth: Payer: Self-pay | Admitting: *Deleted

## 2015-07-17 NOTE — Telephone Encounter (Signed)
  Oncology Nurse Navigator Documentation    Navigator Encounter Type: Telephone (07/17/15 1200) Telephone: Lahoma Crocker Call;Clinic/MDC Follow-up (07/17/15 1200)             Barriers/Navigation Needs: No Questions;No Needs (07/17/15 1200)   Interventions: None required (07/17/15 1200)                      Time Spent with Patient: 15 (07/17/15 1200)

## 2015-07-18 ENCOUNTER — Other Ambulatory Visit: Payer: Self-pay | Admitting: Surgery

## 2015-07-18 DIAGNOSIS — C50912 Malignant neoplasm of unspecified site of left female breast: Secondary | ICD-10-CM

## 2015-07-20 ENCOUNTER — Encounter: Payer: Self-pay | Admitting: *Deleted

## 2015-08-02 ENCOUNTER — Encounter (HOSPITAL_COMMUNITY)
Admission: RE | Admit: 2015-08-02 | Discharge: 2015-08-02 | Disposition: A | Discharge status: Home / Self Care | Payer: Medicare Other | Source: Ambulatory Visit | Attending: Surgery | Admitting: Surgery

## 2015-08-02 ENCOUNTER — Encounter (HOSPITAL_COMMUNITY): Payer: Self-pay

## 2015-08-02 ENCOUNTER — Other Ambulatory Visit (HOSPITAL_COMMUNITY): Payer: Self-pay | Admitting: *Deleted

## 2015-08-02 DIAGNOSIS — C50912 Malignant neoplasm of unspecified site of left female breast: Secondary | ICD-10-CM | POA: Diagnosis not present

## 2015-08-02 DIAGNOSIS — Z01812 Encounter for preprocedural laboratory examination: Secondary | ICD-10-CM | POA: Insufficient documentation

## 2015-08-02 HISTORY — DX: Unspecified osteoarthritis, unspecified site: M19.90

## 2015-08-02 HISTORY — DX: Gastro-esophageal reflux disease without esophagitis: K21.9

## 2015-08-02 LAB — CBC
HCT: 42 % (ref 36.0–46.0)
HEMOGLOBIN: 13.1 g/dL (ref 12.0–15.0)
MCH: 29.5 pg (ref 26.0–34.0)
MCHC: 31.2 g/dL (ref 30.0–36.0)
MCV: 94.6 fL (ref 78.0–100.0)
Platelets: 226 10*3/uL (ref 150–400)
RBC: 4.44 MIL/uL (ref 3.87–5.11)
RDW: 12.5 % (ref 11.5–15.5)
WBC: 5.5 10*3/uL (ref 4.0–10.5)

## 2015-08-02 NOTE — Pre-Procedure Instructions (Signed)
Kiara Ruiz  08/02/2015     Your procedure is scheduled on August 08, 2015 at 7:30 AM.   Report to [facility] Entrance "A" Admitting Office  at 5:30 AM.   Call this number if you have problems the morning of surgery: [phone]   Any questions prior to day of surgery, please call [phone] between 8 & 4 PM.   Remember:  Do not eat food or drink liquids after midnight Monday,  08/07/15.  Take these medicines the morning of surgery: You may use your eye drops.  Stop Aspirin and Multivitamins as of today.   Do not wear jewelry, make-up or nail polish.  Do not wear lotions, powders, or perfumes.  You may NOT wear deodorant.  Do not shave 48 hours prior to surgery.    Do not bring valuables to the hospital.  [facility] is not responsible for any belongings or valuables.  Contacts, dentures or bridgework may not be worn into surgery.  Leave your suitcase in the car.  After surgery it may be brought to your room.  For patients admitted to the hospital, discharge time will be determined by your treatment team.  Patients discharged the day of surgery will not be allowed to drive home.   Special instructions:  [place] - Preparing for Surgery  Before surgery, you can play an important role.  Because skin is not sterile, your skin needs to be as free of germs as possible.  You can reduce the number of germs on you skin by washing with CHG (chlorahexidine gluconate) soap before surgery.  CHG is an antiseptic cleaner which kills germs and bonds with the skin to continue killing germs even after washing.  Please DO NOT use if you have an allergy to CHG or antibacterial soaps.  If your skin becomes reddened/irritated stop using the CHG and inform your nurse when you arrive at Short Stay.  Do not shave (including legs and underarms) for at least 48 hours prior to the first CHG shower.  You may shave your face.  Please follow these instructions carefully:   1.  Shower with  CHG Soap the night before surgery and the                                morning of Surgery.  2.  If you choose to wash your hair, wash your hair first as usual with your       normal shampoo.  3.  After you shampoo, rinse your hair and body thoroughly to remove the                      Shampoo.  4.  Use CHG as you would any other liquid soap.  You can apply chg directly       to the skin and wash gently with scrungie or a clean washcloth.  5.  Apply the CHG Soap to your body ONLY FROM THE NECK DOWN.        Do not use on open wounds or open sores.  Avoid contact with your eyes, ears, mouth and genitals (private parts).  Wash genitals (private parts) with your normal soap.  6.  Wash thoroughly, paying special attention to the area where your surgery        will be performed.  7.  Thoroughly rinse your body with warm water from the neck down.  8.  DO NOT shower/wash with your normal soap after using and rinsing off       the CHG Soap.  9.  Pat yourself dry with a clean towel.            10.  Wear clean pajamas.            11.  Place clean sheets on your bed the night of your first shower and do not        sleep with pets.  Day of Surgery  Do not apply any lotions/deodorants the morning of surgery.  Please wear clean clothes to the hospital.  Please read over the following fact sheets that you were given. Pain Booklet, Coughing and Deep Breathing and Surgical Site Infection Prevention

## 2015-08-03 HISTORY — PX: BREAST BIOPSY: SHX20

## 2015-08-04 ENCOUNTER — Ambulatory Visit
Admission: RE | Admit: 2015-08-04 | Discharge: 2015-08-04 | Disposition: A | Discharge status: Home / Self Care | Payer: Medicare Other | Source: Ambulatory Visit | Attending: Surgery | Admitting: Surgery

## 2015-08-04 DIAGNOSIS — C50912 Malignant neoplasm of unspecified site of left female breast: Secondary | ICD-10-CM

## 2015-08-07 MED ORDER — DEXTROSE 5 % IV SOLN
3.0000 g | INTRAVENOUS | Status: AC
  Administered 2015-08-08: 3 g via INTRAVENOUS
  Filled 2015-08-07: qty 3000

## 2015-08-07 NOTE — Anesthesia Preprocedure Evaluation (Signed)
Anesthesia Evaluation  Patient identified by MRN, date of birth, ID band Patient awake    Reviewed: Allergy & Precautions, H&P , NPO status , Patient's Chart, lab work & pertinent test results  Airway Mallampati: II  TM Distance: >3 FB Neck ROM: full    Dental no notable dental hx. (+) Dental Advisory Given, Teeth Intact   Pulmonary neg pulmonary ROS,    Pulmonary exam normal breath sounds clear to auscultation       Cardiovascular Exercise Tolerance: Good negative cardio ROS Normal cardiovascular exam Rhythm:regular Rate:Normal     Neuro/Psych glaucoma negative neurological ROS  negative psych ROS   GI/Hepatic negative GI ROS, Neg liver ROS,   Endo/Other  negative endocrine ROS  Renal/GU negative Renal ROS  negative genitourinary   Musculoskeletal   Abdominal   Peds  Hematology negative hematology ROS (+)   Anesthesia Other Findings   Reproductive/Obstetrics negative OB ROS                             Anesthesia Physical Anesthesia Plan  ASA: II  Anesthesia Plan: General   Post-op Pain Management: GA combined w/ Regional for post-op pain   Induction: Intravenous  Airway Management Planned: LMA  Additional Equipment:   Intra-op Plan:   Post-operative Plan:   Informed Consent: I have reviewed the patients History and Physical, chart, labs and discussed the procedure including the risks, benefits and alternatives for the proposed anesthesia with the patient or authorized representative who has indicated his/her understanding and acceptance.   Dental Advisory Given  Plan Discussed with: CRNA  Anesthesia Plan Comments:         Anesthesia Quick Evaluation

## 2015-08-08 ENCOUNTER — Ambulatory Visit (HOSPITAL_COMMUNITY)
Admission: RE | Admit: 2015-08-08 | Discharge: 2015-08-08 | Disposition: A | Discharge status: Home / Self Care | Payer: Medicare Other | Source: Ambulatory Visit | Attending: Surgery | Admitting: Surgery

## 2015-08-08 ENCOUNTER — Ambulatory Visit
Admission: RE | Admit: 2015-08-08 | Discharge: 2015-08-08 | Disposition: A | Discharge status: Home / Self Care | Payer: Medicare Other | Source: Ambulatory Visit | Attending: Surgery | Admitting: Surgery

## 2015-08-08 ENCOUNTER — Ambulatory Visit (HOSPITAL_COMMUNITY): Payer: Medicare Other | Admitting: Certified Registered Nurse Anesthetist

## 2015-08-08 ENCOUNTER — Encounter (HOSPITAL_COMMUNITY): Payer: Self-pay | Admitting: *Deleted

## 2015-08-08 ENCOUNTER — Encounter (HOSPITAL_COMMUNITY)
Admission: RE | Disposition: A | Discharge status: Home / Self Care | Payer: Self-pay | Source: Ambulatory Visit | Attending: Surgery

## 2015-08-08 DIAGNOSIS — C50912 Malignant neoplasm of unspecified site of left female breast: Secondary | ICD-10-CM | POA: Diagnosis not present

## 2015-08-08 DIAGNOSIS — H409 Unspecified glaucoma: Secondary | ICD-10-CM | POA: Insufficient documentation

## 2015-08-08 HISTORY — PX: BREAST LUMPECTOMY WITH RADIOACTIVE SEED AND SENTINEL LYMPH NODE BIOPSY: SHX6550

## 2015-08-08 SURGERY — BREAST LUMPECTOMY WITH RADIOACTIVE SEED AND SENTINEL LYMPH NODE BIOPSY
Anesthesia: General | Site: Breast | Laterality: Left

## 2015-08-08 MED ORDER — LACTATED RINGERS IV SOLN
INTRAVENOUS | Status: DC | PRN
  Administered 2015-08-08: 07:00:00 via INTRAVENOUS

## 2015-08-08 MED ORDER — OXYCODONE-ACETAMINOPHEN 5-325 MG PO TABS
ORAL_TABLET | ORAL | Status: AC
  Filled 2015-08-08: qty 1

## 2015-08-08 MED ORDER — MIDAZOLAM HCL 2 MG/2ML IJ SOLN
INTRAMUSCULAR | Status: AC
  Filled 2015-08-08: qty 2

## 2015-08-08 MED ORDER — METHYLENE BLUE 0.5 % INJ SOLN
INTRAVENOUS | Status: AC
Start: 2015-08-08 — End: 2015-08-08
  Filled 2015-08-08: qty 10

## 2015-08-08 MED ORDER — FENTANYL CITRATE (PF) 100 MCG/2ML IJ SOLN
INTRAMUSCULAR | Status: AC
  Filled 2015-08-08: qty 2

## 2015-08-08 MED ORDER — BUPIVACAINE-EPINEPHRINE (PF) 0.5% -1:200000 IJ SOLN
INTRAMUSCULAR | Status: DC | PRN
  Administered 2015-08-08: 30 mL

## 2015-08-08 MED ORDER — BUPIVACAINE-EPINEPHRINE (PF) 0.25% -1:200000 IJ SOLN
INTRAMUSCULAR | Status: AC
  Filled 2015-08-08: qty 30

## 2015-08-08 MED ORDER — OXYCODONE-ACETAMINOPHEN 5-325 MG PO TABS: 1.0000 | ORAL_TABLET | ORAL | Status: DC | PRN

## 2015-08-08 MED ORDER — BUPIVACAINE-EPINEPHRINE 0.25% -1:200000 IJ SOLN
INTRAMUSCULAR | Status: DC | PRN
  Administered 2015-08-08: 16 mL

## 2015-08-08 MED ORDER — OXYCODONE-ACETAMINOPHEN 5-325 MG PO TABS
1.0000 | ORAL_TABLET | Freq: Once | ORAL | Status: AC
  Administered 2015-08-08: 1 via ORAL

## 2015-08-08 MED ORDER — FENTANYL CITRATE (PF) 250 MCG/5ML IJ SOLN
INTRAMUSCULAR | Status: AC
  Filled 2015-08-08: qty 5

## 2015-08-08 MED ORDER — TECHNETIUM TC 99M SULFUR COLLOID FILTERED
1.0000 | Freq: Once | INTRAVENOUS | Status: AC | PRN
Start: 2015-08-08 — End: 2015-08-08
  Administered 2015-08-08: 1 via INTRADERMAL

## 2015-08-08 MED ORDER — MIDAZOLAM HCL 2 MG/2ML IJ SOLN
INTRAMUSCULAR | Status: DC | PRN
  Administered 2015-08-08: 1 mg via INTRAVENOUS

## 2015-08-08 MED ORDER — SODIUM CHLORIDE 0.9 % IJ SOLN
INTRAMUSCULAR | Status: AC
  Filled 2015-08-08: qty 10

## 2015-08-08 MED ORDER — ONDANSETRON HCL 4 MG/2ML IJ SOLN
INTRAMUSCULAR | Status: DC | PRN
  Administered 2015-08-08: 4 mg via INTRAVENOUS

## 2015-08-08 MED ORDER — PROPOFOL 10 MG/ML IV BOLUS
INTRAVENOUS | Status: DC | PRN
  Administered 2015-08-08: 150 mg via INTRAVENOUS

## 2015-08-08 MED ORDER — DEXAMETHASONE SODIUM PHOSPHATE 10 MG/ML IJ SOLN
INTRAMUSCULAR | Status: DC | PRN
  Administered 2015-08-08: 5 mg via INTRAVENOUS

## 2015-08-08 MED ORDER — CHLORHEXIDINE GLUCONATE 4 % EX LIQD: 1.0000 "application " | Freq: Once | CUTANEOUS | Status: DC

## 2015-08-08 MED ORDER — PHENYLEPHRINE HCL 10 MG/ML IJ SOLN
INTRAMUSCULAR | Status: DC | PRN
  Administered 2015-08-08 (×4): 40 ug via INTRAVENOUS

## 2015-08-08 MED ORDER — FENTANYL CITRATE (PF) 100 MCG/2ML IJ SOLN
25.0000 ug | INTRAMUSCULAR | Status: DC | PRN
  Administered 2015-08-08: 25 ug via INTRAVENOUS
  Administered 2015-08-08: 50 ug via INTRAVENOUS
  Administered 2015-08-08: 25 ug via INTRAVENOUS

## 2015-08-08 MED ORDER — PROPOFOL 10 MG/ML IV BOLUS
INTRAVENOUS | Status: AC
  Filled 2015-08-08: qty 60

## 2015-08-08 MED ORDER — FENTANYL CITRATE (PF) 250 MCG/5ML IJ SOLN
INTRAMUSCULAR | Status: DC | PRN
  Administered 2015-08-08 (×3): 25 ug via INTRAVENOUS
  Administered 2015-08-08: 50 ug via INTRAVENOUS

## 2015-08-08 MED ORDER — LIDOCAINE HCL (CARDIAC) 20 MG/ML IV SOLN
INTRAVENOUS | Status: DC | PRN
  Administered 2015-08-08: 60 mg via INTRAVENOUS

## 2015-08-08 MED ORDER — SODIUM CHLORIDE 0.9 % IJ SOLN
INTRAVENOUS | Status: DC | PRN
  Administered 2015-08-08: 5 mL via INTRAMUSCULAR

## 2015-08-08 MED ORDER — 0.9 % SODIUM CHLORIDE (POUR BTL) OPTIME
TOPICAL | Status: DC | PRN
  Administered 2015-08-08: 1000 mL

## 2015-08-08 SURGICAL SUPPLY — 50 items
APPLIER CLIP 9.375 MED OPEN (MISCELLANEOUS) ×3
APR CLP MED 9.3 20 MLT OPN (MISCELLANEOUS) ×1
BINDER BREAST LRG (GAUZE/BANDAGES/DRESSINGS) IMPLANT
BINDER BREAST XLRG (GAUZE/BANDAGES/DRESSINGS) ×2 IMPLANT
BLADE SURG 15 STRL LF DISP TIS (BLADE) ×1 IMPLANT
BLADE SURG 15 STRL SS (BLADE) ×3
CANISTER SUCTION 2500CC (MISCELLANEOUS) ×3 IMPLANT
CHLORAPREP W/TINT 26ML (MISCELLANEOUS) ×3 IMPLANT
CLIP APPLIE 9.375 MED OPEN (MISCELLANEOUS) ×1 IMPLANT
CONT SPEC STER OR (MISCELLANEOUS) ×3 IMPLANT
COVER PROBE W GEL 5X96 (DRAPES) ×3 IMPLANT
DRAPE CHEST BREAST 15X10 FENES (DRAPES) ×3 IMPLANT
DRAPE UTILITY XL STRL (DRAPES) ×6 IMPLANT
ELECT CAUTERY BLADE 6.4 (BLADE) ×3 IMPLANT
ELECT REM PT RETURN 9FT ADLT (ELECTROSURGICAL) ×3
ELECTRODE REM PT RTRN 9FT ADLT (ELECTROSURGICAL) ×1 IMPLANT
GLOVE BIO SURGEON STRL SZ 6.5 (GLOVE) ×1 IMPLANT
GLOVE BIO SURGEON STRL SZ8 (GLOVE) ×5 IMPLANT
GLOVE BIO SURGEONS STRL SZ 6.5 (GLOVE) ×1
GLOVE BIOGEL PI IND STRL 7.0 (GLOVE) IMPLANT
GLOVE BIOGEL PI IND STRL 8 (GLOVE) ×1 IMPLANT
GLOVE BIOGEL PI INDICATOR 7.0 (GLOVE) ×2
GLOVE BIOGEL PI INDICATOR 8 (GLOVE) ×2
GOWN STRL REUS W/ TWL LRG LVL3 (GOWN DISPOSABLE) ×1 IMPLANT
GOWN STRL REUS W/ TWL XL LVL3 (GOWN DISPOSABLE) ×1 IMPLANT
GOWN STRL REUS W/TWL LRG LVL3 (GOWN DISPOSABLE) ×3
GOWN STRL REUS W/TWL XL LVL3 (GOWN DISPOSABLE) ×3
KIT BASIN OR (CUSTOM PROCEDURE TRAY) ×3 IMPLANT
KIT MARKER MARGIN INK (KITS) ×3 IMPLANT
LIQUID BAND (GAUZE/BANDAGES/DRESSINGS) ×3 IMPLANT
NDL FILTER BLUNT 18X1 1/2 (NEEDLE) IMPLANT
NDL HYPO 25X1 1.5 SAFETY (NEEDLE) ×1 IMPLANT
NDL SAFETY ECLIPSE 18X1.5 (NEEDLE) IMPLANT
NEEDLE FILTER BLUNT 18X 1/2SAF (NEEDLE) ×2
NEEDLE FILTER BLUNT 18X1 1/2 (NEEDLE) ×1 IMPLANT
NEEDLE HYPO 18GX1.5 SHARP (NEEDLE) ×3
NEEDLE HYPO 25X1 1.5 SAFETY (NEEDLE) ×6 IMPLANT
NS IRRIG 1000ML POUR BTL (IV SOLUTION) ×3 IMPLANT
PACK SURGICAL SETUP 50X90 (CUSTOM PROCEDURE TRAY) ×3 IMPLANT
PENCIL BUTTON HOLSTER BLD 10FT (ELECTRODE) ×3 IMPLANT
SPONGE LAP 18X18 X RAY DECT (DISPOSABLE) ×3 IMPLANT
SUT MNCRL AB 4-0 PS2 18 (SUTURE) ×3 IMPLANT
SUT VIC AB 3-0 SH 18 (SUTURE) ×3 IMPLANT
SYR BULB 3OZ (MISCELLANEOUS) ×3 IMPLANT
SYR CONTROL 10ML LL (SYRINGE) ×3 IMPLANT
TOWEL OR 17X24 6PK STRL BLUE (TOWEL DISPOSABLE) ×3 IMPLANT
TOWEL OR 17X26 10 PK STRL BLUE (TOWEL DISPOSABLE) ×3 IMPLANT
TUBE CONNECTING 12'X1/4 (SUCTIONS) ×1
TUBE CONNECTING 12X1/4 (SUCTIONS) ×2 IMPLANT
YANKAUER SUCT BULB TIP NO VENT (SUCTIONS) ×3 IMPLANT

## 2015-08-08 NOTE — Transfer of Care (Signed)
Immediate Anesthesia Transfer of Care Note  Patient: Kiara Ruiz  Procedure(s) Performed: Procedure(s): BREAST LUMPECTOMY WITH RADIOACTIVE SEED AND SENTINEL LYMPH NODE BIOPSY (Left)  Patient Location: PACU  Anesthesia Type:General  Level of Consciousness: awake, alert  and oriented  Airway & Oxygen Therapy: Patient Spontanous Breathing  Post-op Assessment: Report given to RN and Post -op Vital signs reviewed and stable  Post vital signs: Reviewed and stable  Last Vitals:  Filed Vitals:   08/08/15 0604  BP: 150/75  Pulse: 69  Temp: 36.6 C  Resp: 20    Last Pain: There were no vitals filed for this visit.       Complications: No apparent anesthesia complications

## 2015-08-08 NOTE — Op Note (Signed)
Preoperative diagnosis: Stage I left breast cancer  Postoperative diagnosis:  Same  Procedure: Left breast seed localized partial mastectomy with left axillary  Sentinel lymph node mapping with methylene blue dye  Surgeon: Erroll Luna M.D.  Anesthesia: Gen. With pectoral block and 0.25% Sensorcaine local with epinephrine  EBL: Minimal  Specimens: Left breast mass with seed in clip specimen with reexcision of all margins but posterior margin  And 2 left axillary sentinel nodes   Drains: None  Indications for procedure: The patient presents for left breast seed localized partial mastectomy and left axillary sentinel lymph node mapping for stage I left breast cancer. She was seen and evaluated by medical oncology, surgery and radiation collagen he has opted for breast conservation.The procedure has been discussed with the patient. Alternatives to surgery have been discussed with the patient.  Risks of surgery include bleeding,  Infection,  Seroma formation, death,  and the need for further surgery.   The patient understands and wishes to proceed.Sentinel lymph node mapping and dissection has been discussed with the patient.  Risk of bleeding,  Infection,  Seroma formation,  Additional procedures,,  Shoulder weakness ,  Shoulder stiffness,  Nerve and blood vessel injury and reaction to the mapping dyes have been discussed.  Alternatives to surgery have been discussed with the patient.  The patient agrees to proceed.  Description of procedure: The patient was met in the holding area and questions are answered. Left breast was marked as the correct side and neoprobe was used to verify seed location. The patient underwent a pectoral block by anesthesia. She was taken  Back to the operating room and placed supine on the OR table. After induction of general anesthesia, the left breast was prepped and draped in a sterile fashion and timeout was done. 4 mL of methylene blue dye was injected under the  nipple on the left.  Massage was done. Neoprobe was used to seed was localized in the left breast lower outer quadrant. Curvilinear incision was made over the area and dissection was carried down to the location of the seating clip which were quite deep. The mass was removed with adequate gross margins in the deep margin was the pectoralis muscle. I reexcised all margins circumferentially and sent these as separate specimens. The cavity was irrigated. Clips are placed in the cavity to mark it. Hemostasis was achieved cavity was closed with 3-0 Vicryl and 4-0 Monocryl.  Neoprobe was used and the sentinel node was identified in the left axilla. Local anesthesia was infiltrated in the incision. Incision made left axilla dissection was carried down into the axillary contents. 2 blue hot level I sentinel lymph nodes were identified and removed. Background counts approached 0. Hemostasis achieved cavity closed with 3-0 Vicryl and 4-0 Monocryl. Liquid adhesive applied to both incisions. Breast binder placed. All final counts found to be correct. Patient awoke extubated taken recovery in satisfactory condition.

## 2015-08-08 NOTE — Anesthesia Procedure Notes (Addendum)
Procedure Name: LMA Insertion Date/Time: 08/08/2015 7:46 AM Performed by: Merdis Delay Pre-anesthesia Checklist: Patient identified, Timeout performed, Emergency Drugs available, Suction available and Patient being monitored Patient Re-evaluated:Patient Re-evaluated prior to inductionOxygen Delivery Method: Circle system utilized Preoxygenation: Pre-oxygenation with 100% oxygen Intubation Type: IV induction LMA: LMA inserted LMA Size: 4.0 Number of attempts: 1 Placement Confirmation: positive ETCO2,  CO2 detector and breath sounds checked- equal and bilateral Tube secured with: Tape Dental Injury: Teeth and Oropharynx as per pre-operative assessment     Anesthesia Regional Block:  Pectoralis block  Pre-Anesthetic Checklist: ,, timeout performed, Correct Patient, Correct Site, Correct Laterality, Correct Procedure, Correct Position, site marked, Risks and benefits discussed,  Surgical consent,  Pre-op evaluation,  At surgeon's request and post-op pain management  Laterality: Left  Prep: chloraprep       Needles:  Injection technique: Single-shot  Needle Type: Echogenic Needle     Needle Length: 9cm 9 cm Needle Gauge: 21 and 21 G    Additional Needles:  Procedures: ultrasound guided (picture in chart) Pectoralis block Narrative:  Start time: 08/08/2015 7:20 AM End time: 08/08/2015 7:25 AM Injection made incrementally with aspirations every 5 mL.  Performed by: Personally  Anesthesiologist: Rod Mae

## 2015-08-08 NOTE — Interval H&P Note (Signed)
History and Physical Interval Note:  08/08/2015 6:54 AM  Kiara Ruiz  has presented today for surgery, with the diagnosis of left breast cancer  The various methods of treatment have been discussed with the patient and family. After consideration of risks, benefits and other options for treatment, the patient has consented to  Procedure(s): BREAST LUMPECTOMY WITH RADIOACTIVE SEED AND SENTINEL LYMPH NODE BIOPSY (Left) as a surgical intervention .  The patient's history has been reviewed, patient examined, no change in status, stable for surgery.  I have reviewed the patient's chart and labs.  Questions were answered to the patient's satisfaction.     [name] A.

## 2015-08-08 NOTE — Anesthesia Postprocedure Evaluation (Addendum)
Anesthesia Post Note  Patient: Kiara Ruiz  Procedure(s) Performed: Procedure(s) (LRB): BREAST LUMPECTOMY WITH RADIOACTIVE SEED AND SENTINEL LYMPH NODE BIOPSY (Left)  Patient location during evaluation: PACU Anesthesia Type: General Level of consciousness: awake and alert Pain management: pain level controlled Vital Signs Assessment: post-procedure vital signs reviewed and stable Respiratory status: spontaneous breathing, nonlabored ventilation, respiratory function stable and patient connected to nasal cannula oxygen Cardiovascular status: blood pressure returned to baseline and stable Postop Assessment: no signs of nausea or vomiting Anesthetic complications: no    Last Vitals:  Filed Vitals:   08/08/15 1043 08/08/15 1100  BP:  160/85  Pulse:  68  Temp: 36.8 C   Resp:  18    Last Pain:  Filed Vitals:   08/08/15 1101  PainSc: Asleep                 Evelise Reine L

## 2015-08-08 NOTE — H&P (View-Only) (Signed)
Radiation Oncology         [phone]) [phone] ________________________________  Initial Outpatient Consultation - Date: 07/12/2015   Name: Kiara Ruiz MRN: [id number]   DOB: [birth year]  REFERRING PHYSICIAN: Erroll Luna, MD  DIAGNOSIS AND STAGE: T2N0 invasive lobular carcinoma of the left breast  HISTORY OF PRESENT ILLNESS::Kiara Ruiz is a 70 y.o. female who had a screening mammogram on 06/15/15. This was breast density category D (extremely dense breast tissue) and it noted a possible distortion in the left and right breasts. Mammogram on 06/20/15 noted two sites of distortion in the right breast with one immediately deep to the medial aspect of the nipple and the other slightly more medially. In the lateral posterior depth of the left breast, there was a possible distortion and a possible associated obscured mass. Ultrasound targeted to the left breast demonstrated extremely dense fibroglandular tissue, multiple scattered and confluent areas of shadowing, and not definite masses were identified. Biopsy of the LOQ of the left breast on 06/29/15 showed low grade invasive lobular carcinoma (ER positive, PR negative, HER2 negative, Ki67 15%). MRI of the bilateral breasts on 07/10/15 showed an irregular mass and non-mass enhancement measuring 4.3 x 2.3 x 2.2 cm in the LOQ of the left breast. There were no abnormal appearing lymph nodes and no suspicious enhancement within the right breast.  She is present with her husband today. She is retired from the fashion industry creating patterns for clothing.   PREVIOUS RADIATION THERAPY: No  Past medical, social and family history were reviewed in the electronic chart. Review of symptoms was reviewed in the electronic chart. Medications were reviewed in the electronic chart.   Gynecologic History  Age at first menstrual period? 11  Are you still having periods? No  If you no longer have periods: Have you used hormone replacement? Yes  If YES, for how  long? ~ 10 years When did you stop? ~ age 66 Obstetric History:  How many children have you carried to term? 1 Your age at first live birth? 34  Pregnant now or trying to get pregnant? No  Have you used birth control pills or hormone shots for contraception? Yes  If so, for how long (or approximate dates)? 1 year, ~ Isla Pence  Would you be interested in learning more about the options to preserve fertility? No Health Maintenance:  Have you ever had a colonoscopy? Yes If yes, date? 2011  Have you ever had a bone density? Yes If yes, date? 2016  Date of your last PAP smear? 2015 Date of your FIRST mammogram? 1970  The patient has a medical history of wearing glasses/contacts and back pain.  PHYSICAL EXAM: Vitals with BMI 07/12/2015  Height _0   Weight 158 lbs  BMI 29  Systolic 784  Diastolic 81  Pulse 86  Respirations 18   Alert and oriented x 3. In no acute distress. Good range of motion in the upper extremities. She has a palpable 4 cm mass in the posterior aspect of the left breast. No palpable abnormalities of the right breast. No palpable cervical, supraclavicular, or axillary adenopathy.  IMPRESSION: T2N0 invasive lobular carcinoma of the left breast  PLAN: I spoke to the patient today regarding her diagnosis and options for treatment. We discussed the equivalence in terms of survival and local failure between mastectomy and breast conservation. We discussed the role of radiation in decreasing local failures in patients who undergo lumpectomy. We discussed the process of CT simulation and the placement  tattoos. We discussed 6 weeks of treatment as an outpatient. We discussed the possibility of asymptomatic lung damage. We discussed the low likelihood of secondary malignancies. We discussed the possible side effects including but not limited to skin redness, fatigue, permanent skin darkening, and breast swelling. I will see her back after her Oncotype score. I did clarify with her that if  she needed chemotherapy this would be performed prior to radiation. She met with medical oncology as well as a member of our patient family support team and our physical therapist. I will plan on seeing her back after her surgery. We will use breath-hold technique for cardiac sparring if needed although with her breast size, we may need to use prone technique.   We also discussed breast reductions but she is not interested at her age, she states.  I spent 40 minutes  face to face with the patient and more than 50% of that time was spent in counseling and/or coordination of care.   ------------------------------------------------  Kiara Silversmith, MD  This document serves as a record of services personally performed by Kiara Silversmith, MD. It was created on her behalf by Darcus Austin, a trained medical scribe. The creation of this record is based on the scribe's personal observations and the provider's statements to them. This document has been checked and approved by the attending provider.

## 2015-08-08 NOTE — Discharge Instructions (Signed)
[facility] °Office Phone Number [phone] ° °BREAST BIOPSY/ PARTIAL MASTECTOMY: POST OP INSTRUCTIONS ° °Always review your discharge instruction sheet given to you by the facility where your surgery was performed. ° °IF YOU HAVE DISABILITY OR FAMILY LEAVE FORMS, YOU MUST BRING THEM TO THE OFFICE FOR PROCESSING.  DO NOT GIVE THEM TO YOUR DOCTOR. ° °1. A prescription for pain medication may be given to you upon discharge.  Take your pain medication as prescribed, if needed.  If narcotic pain medicine is not needed, then you may take acetaminophen (Tylenol) or ibuprofen (Advil) as needed. °2. Take your usually prescribed medications unless otherwise directed °3. If you need a refill on your pain medication, please contact your pharmacy.  They will contact our office to request authorization.  Prescriptions will not be filled after 5pm or on week-ends. °4. You should eat very light the first 24 hours after surgery, such as soup, crackers, pudding, etc.  Resume your normal diet the day after surgery. °5. Most patients will experience some swelling and bruising in the breast.  Ice packs and a good support bra will help.  Swelling and bruising can take several days to resolve.  °6. It is common to experience some constipation if taking pain medication after surgery.  Increasing fluid intake and taking a stool softener will usually help or prevent this problem from occurring.  A mild laxative (Milk of Magnesia or Miralax) should be taken according to package directions if there are no bowel movements after 48 hours. °7. Unless discharge instructions indicate otherwise, you may remove your bandages 24-48 hours after surgery, and you may shower at that time.  You may have steri-strips (small skin tapes) in place directly over the incision.  These strips should be left on the skin for 7-10 days.  If your surgeon used skin glue on the incision, you may shower in 24 hours.  The glue will flake off over the  next 2-3 weeks.  Any sutures or staples will be removed at the office during your follow-up visit. °8. ACTIVITIES:  You may resume regular daily activities (gradually increasing) beginning the next day.  Wearing a good support bra or sports bra minimizes pain and swelling.  You may have sexual intercourse when it is comfortable. °a. You may drive when you no longer are taking prescription pain medication, you can comfortably wear a seatbelt, and you can safely maneuver your car and apply brakes. °b. RETURN TO WORK:  ______________________________________________________________________________________ °9. You should see your doctor in the office for a follow-up appointment approximately two weeks after your surgery.  Your doctor’s nurse will typically make your follow-up appointment when she calls you with your pathology report.  Expect your pathology report 2-3 business days after your surgery.  You may call to check if you do not hear from us after three days. °10. OTHER INSTRUCTIONS: _______________________________________________________________________________________________ _____________________________________________________________________________________________________________________________________ °_____________________________________________________________________________________________________________________________________ °_____________________________________________________________________________________________________________________________________ ° °WHEN TO CALL YOUR DOCTOR: °1. Fever over 101.0 °2. Nausea and/or vomiting. °3. Extreme swelling or bruising. °4. Continued bleeding from incision. °5. Increased pain, redness, or drainage from the incision. ° °The clinic staff is available to answer your questions during regular business hours.  Please don’t hesitate to call and ask to speak to one of the nurses for clinical concerns.  If you have a medical emergency, go to the nearest  emergency room or call 911.  A surgeon from [facility] is always on call at the hospital. ° °For further questions, please visit [email or web address]  °

## 2015-08-08 NOTE — Addendum Note (Signed)
Addendum  created 08/08/15 1107 by Rod Mae, MD   Modules edited: Clinical Notes   Clinical Notes:  File: [id number]

## 2015-08-09 ENCOUNTER — Encounter (HOSPITAL_COMMUNITY): Payer: Self-pay | Admitting: Surgery

## 2015-08-10 ENCOUNTER — Telehealth: Payer: Self-pay | Admitting: *Deleted

## 2015-08-10 HISTORY — PX: BREAST LUMPECTOMY: SHX2

## 2015-08-10 NOTE — Telephone Encounter (Signed)
  Oncology Nurse Navigator Documentation    Navigator Encounter Type: Telephone (08/10/15 1100) Telephone: Jerilee Hoh Confirmation/Clarification;Patient Update (08/10/15 1100)  Pt relate doing well after sx. Denies complaints or needs at this time.   Surgery Date: 08/08/15 (08/10/15 1100) Treatment Initiated Date: 08/08/15 (08/10/15 1100)                      Acuity: Level 2 (08/10/15 1100)         Time Spent with Patient: 15 (08/10/15 1100)

## 2015-08-11 ENCOUNTER — Ambulatory Visit: Payer: Self-pay | Admitting: Surgery

## 2015-08-15 ENCOUNTER — Encounter: Payer: Self-pay | Admitting: Hematology and Oncology

## 2015-08-15 ENCOUNTER — Ambulatory Visit (HOSPITAL_BASED_OUTPATIENT_CLINIC_OR_DEPARTMENT_OTHER): Payer: Medicare Other | Admitting: Hematology and Oncology

## 2015-08-15 ENCOUNTER — Telehealth: Payer: Self-pay | Admitting: *Deleted

## 2015-08-15 VITALS — BP 139/71 | HR 80 | Temp 98.3°F | Resp 18 | Ht 62.0 in | Wt 156.7 lb

## 2015-08-15 DIAGNOSIS — C50512 Malignant neoplasm of lower-outer quadrant of left female breast: Secondary | ICD-10-CM | POA: Diagnosis not present

## 2015-08-15 NOTE — Telephone Encounter (Signed)
Ordered Mammaprint per MD.  Faxed order to [facility] and called Varney Biles to verify she received it.  Placed a note to f/u for results.

## 2015-08-15 NOTE — Progress Notes (Signed)
Patient Care Team: Lucianne Lei, MD as PCP - General (Family Medicine)  DIAGNOSIS: Breast cancer of lower-outer quadrant of left female breast [facility])   Staging form: Breast, AJCC 7th Edition     Clinical stage from 07/12/2015: Stage IIA (T2, N0, M0) - Unsigned       Staging comments: Staged at breast conference on 5.10.17  SUMMARY OF ONCOLOGIC HISTORY:   Breast cancer of lower-outer quadrant of left female breast ([place])   06/29/2015 Initial Diagnosis Left breast biopsy LOQ: invasive lobular carcinoma, grade 1, ER 80%, PR 0%, Ki-67 15%, HER-2 negative ratio 1.24; screening detected distortion without ultrasound correlate    07/10/2015 Breast MRI Left breast posterior lower outer quadrant: Irregular mass and NME 4.3 x 2.3 x 2.2 cm, no lymph nodes, T2 N0 stage II a clinical stage   08/08/2015 Surgery Left lumpectomy: Invasive lobular carcinoma, grade 2, 3.1 cm, broad-based was in the posterior, and inferior margin, LCIS, perineural invasion present, no LV I, ER 80%, PR 0%, HER-2 negative, Ki-67 15%, T2 N1 a stage IIB    CHIEF COMPLIANT: Follow-up after recent lumpectomy  INTERVAL HISTORY: Kiara Ruiz is a 70 year old with above-mentioned history of left breast cancer underwent left lumpectomy. She is here for follow-up to discuss the final pathology report. She is feeling fairly well from the recent surgery. She is informed that she will need a reexcision for the positive margins.  REVIEW OF SYSTEMS:   Constitutional: Denies fevers, chills or abnormal weight loss Eyes: Denies blurriness of vision Ears, nose, mouth, throat, and face: Denies mucositis or sore throat Respiratory: Denies cough, dyspnea or wheezes Cardiovascular: Denies palpitation, chest discomfort Gastrointestinal:  Denies nausea, heartburn or change in bowel habits Skin: Denies abnormal skin rashes Lymphatics: Denies new lymphadenopathy or easy bruising Neurological:Denies numbness, tingling or new  weaknesses Behavioral/Psych: Mood is stable, no new changes  Extremities: No lower extremity edema Breast: Recent left lumpectomy All other systems were reviewed with the patient and are negative.  I have reviewed the past medical history, past surgical history, social history and family history with the patient and they are unchanged from previous note.  ALLERGIES:  has No Known Allergies.  MEDICATIONS:  Current Outpatient Prescriptions  Medication Sig Dispense Refill  . aspirin 81 MG tablet Take 81 mg by mouth daily.    Marland Kitchen CALCIUM-VITAMIN D PO Take 1 tablet by mouth daily.     Marland Kitchen latanoprost (XALATAN) 0.005 % ophthalmic solution Place 1 drop into both eyes at bedtime.     . Multiple Vitamin (MULTIVITAMIN PO) Take 1 tablet by mouth daily.     Marland Kitchen oxyCODONE-acetaminophen (ROXICET) 5-325 MG tablet Take 1-2 tablets by mouth every 4 (four) hours as needed. 30 tablet 0  . timolol (BETIMOL) 0.5 % ophthalmic solution Place 1 drop into both eyes every morning.      No current facility-administered medications for this visit.    PHYSICAL EXAMINATION: ECOG PERFORMANCE STATUS: 1 - Symptomatic but completely ambulatory  Filed Vitals:   08/15/15 0948  BP: 139/71  Pulse: 80  Temp: 98.3 F (36.8 C)  Resp: 18   Filed Weights   08/15/15 0948  Weight: 156 lb 11.2 oz (71.079 kg)    GENERAL:alert, no distress and comfortable SKIN: skin color, texture, turgor are normal, no rashes or significant lesions EYES: normal, Conjunctiva are pink and non-injected, sclera clear OROPHARYNX:no exudate, no erythema and lips, buccal mucosa, and tongue normal  NECK: supple, thyroid normal size, non-tender, without nodularity LYMPH:  no  palpable lymphadenopathy in the cervical, axillary or inguinal LUNGS: clear to auscultation and percussion with normal breathing effort HEART: regular rate & rhythm and no murmurs and no lower extremity edema ABDOMEN:abdomen soft, non-tender and normal bowel  sounds MUSCULOSKELETAL:no cyanosis of digits and no clubbing  NEURO: alert & oriented x 3 with fluent speech, no focal motor/sensory deficits EXTREMITIES: No lower extremity edema  LABORATORY DATA:  I have reviewed the data as listed   Chemistry      Component Value Date/Time   NA 142 07/12/2015 0826   K 4.4 07/12/2015 0826   CO2 28 07/12/2015 0826   BUN 11.2 07/12/2015 0826   CREATININE 0.9 07/12/2015 0826      Component Value Date/Time   CALCIUM 9.7 07/12/2015 0826   ALKPHOS 73 07/12/2015 0826   AST 15 07/12/2015 0826   ALT 13 07/12/2015 0826   BILITOT 0.50 07/12/2015 0826       Lab Results  Component Value Date   WBC 5.5 08/02/2015   HGB 13.1 08/02/2015   HCT 42.0 08/02/2015   MCV 94.6 08/02/2015   PLT 226 08/02/2015   NEUTROABS 3.7 07/12/2015   ASSESSMENT & PLAN:  Breast cancer of lower-outer quadrant of left female breast ([place]) Left lumpectomy 08/08/2015: Invasive lobular carcinoma, grade 2, 3.1 cm, broad-based was in the posterior, and inferior margin, LCIS, perineural invasion present, no LV I, ER 80%, PR 0%, HER-2 negative, Ki-67 15%, T2 N1 a stage IIB  Pathology counseling: I discussed the final pathology report of the patient provided  a copy of this report. I discussed the margins as well as lymph node surgeries. We also discussed the final staging along with previously performed ER/PR and HER-2/neu testing.  Recommendation: 1. Reexcision of the posterior and inferior margin 2. Mammaprint testing to determine if she would benefit from chemotherapy 3. Adjuvant radiation therapy followed by 4. Adjuvant antiestrogen therapy.   Mammaprint counseling: MINDACT is a prospective, randomized phase III controlled trial that investigates the clinical utility of MammaPrint, when compared to standard clinical pathological criteria, with 6,693 patients enrolled from over 111 institutions. Clinical high-risk patients with a Low Risk MammaPrint result, including 48%  node-positive, had 5-year distant metastasis-free survival rate in excess of 94 percent, whether randomized to receive adjuvant chemotherapy or not proving MammaPrint's ability to safely identify Low Risk patients.  Return to clinic based upon Mammaprint test result.  No orders of the defined types were placed in this encounter.   The patient has a good understanding of the overall plan. she agrees with it. she will call with any problems that may develop before the next visit here.   Rulon Eisenmenger, MD 08/15/2015

## 2015-08-15 NOTE — Assessment & Plan Note (Signed)
Left lumpectomy 08/08/2015: Invasive lobular carcinoma, grade 2, 3.1 cm, broad-based was in the posterior, and inferior margin, LCIS, perineural invasion present, no LV I, ER 80%, PR 0%, HER-2 negative, Ki-67 15%, T2 N1 a stage IIB  Pathology counseling: I discussed the final pathology report of the patient provided  a copy of this report. I discussed the margins as well as lymph node surgeries. We also discussed the final staging along with previously performed ER/PR and HER-2/neu testing.  Recommendation: 1. Reexcision of the posterior and inferior margin 2. Oncotype DX testing to determine if she would benefit from chemotherapy 3. Adjuvant radiation therapy followed by 4. Adjuvant antiestrogen therapy.   Return to clinic based upon Oncotype DX testing

## 2015-08-23 ENCOUNTER — Encounter (HOSPITAL_COMMUNITY): Payer: Self-pay

## 2015-08-23 ENCOUNTER — Encounter (HOSPITAL_BASED_OUTPATIENT_CLINIC_OR_DEPARTMENT_OTHER): Payer: Self-pay | Admitting: *Deleted

## 2015-08-24 ENCOUNTER — Telehealth: Payer: Self-pay | Admitting: *Deleted

## 2015-08-24 NOTE — Telephone Encounter (Signed)
Spoke with patient and discussed low risk mammaprint results.  Informed her she would getting a call to schedule appointment with radiation MD.  Encouraged her to call with any needs or concerns.

## 2015-08-25 NOTE — Progress Notes (Signed)
Pt given 8 oz carton of boost breeze with written and verbal instructions to drink by 630 am morning of surgery, she also informed to drink no other liquids after midnight. Used teach back and pt verbalized understanding

## 2015-08-30 ENCOUNTER — Encounter (HOSPITAL_BASED_OUTPATIENT_CLINIC_OR_DEPARTMENT_OTHER)
Admission: RE | Disposition: A | Discharge status: Home / Self Care | Payer: Self-pay | Source: Ambulatory Visit | Attending: Surgery

## 2015-08-30 ENCOUNTER — Ambulatory Visit (HOSPITAL_BASED_OUTPATIENT_CLINIC_OR_DEPARTMENT_OTHER): Payer: Medicare Other | Admitting: Anesthesiology

## 2015-08-30 ENCOUNTER — Ambulatory Visit (HOSPITAL_BASED_OUTPATIENT_CLINIC_OR_DEPARTMENT_OTHER)
Admission: RE | Admit: 2015-08-30 | Discharge: 2015-08-30 | Disposition: A | Discharge status: Home / Self Care | Payer: Medicare Other | Source: Ambulatory Visit | Attending: Surgery | Admitting: Surgery

## 2015-08-30 ENCOUNTER — Encounter (HOSPITAL_BASED_OUTPATIENT_CLINIC_OR_DEPARTMENT_OTHER): Payer: Self-pay | Admitting: Anesthesiology

## 2015-08-30 DIAGNOSIS — N641 Fat necrosis of breast: Secondary | ICD-10-CM | POA: Diagnosis not present

## 2015-08-30 DIAGNOSIS — Y836 Removal of other organ (partial) (total) as the cause of abnormal reaction of the patient, or of later complication, without mention of misadventure at the time of the procedure: Secondary | ICD-10-CM | POA: Diagnosis not present

## 2015-08-30 DIAGNOSIS — H409 Unspecified glaucoma: Secondary | ICD-10-CM | POA: Insufficient documentation

## 2015-08-30 DIAGNOSIS — C50512 Malignant neoplasm of lower-outer quadrant of left female breast: Secondary | ICD-10-CM

## 2015-08-30 DIAGNOSIS — Z79899 Other long term (current) drug therapy: Secondary | ICD-10-CM | POA: Diagnosis not present

## 2015-08-30 DIAGNOSIS — L7634 Postprocedural seroma of skin and subcutaneous tissue following other procedure: Secondary | ICD-10-CM | POA: Diagnosis not present

## 2015-08-30 DIAGNOSIS — C50912 Malignant neoplasm of unspecified site of left female breast: Secondary | ICD-10-CM | POA: Diagnosis present

## 2015-08-30 HISTORY — PX: RE-EXCISION OF BREAST LUMPECTOMY: SHX6048

## 2015-08-30 SURGERY — EXCISION, LESION, BREAST
Anesthesia: General | Site: Breast | Laterality: Left

## 2015-08-30 MED ORDER — GLYCOPYRROLATE 0.2 MG/ML IJ SOLN: 0.2000 mg | Freq: Once | INTRAMUSCULAR | Status: DC | PRN

## 2015-08-30 MED ORDER — PHENYLEPHRINE 40 MCG/ML (10ML) SYRINGE FOR IV PUSH (FOR BLOOD PRESSURE SUPPORT)
PREFILLED_SYRINGE | INTRAVENOUS | Status: AC
  Filled 2015-08-30: qty 10

## 2015-08-30 MED ORDER — LIDOCAINE HCL (CARDIAC) 20 MG/ML IV SOLN
INTRAVENOUS | Status: DC | PRN
  Administered 2015-08-30: 50 mg via INTRAVENOUS

## 2015-08-30 MED ORDER — DEXAMETHASONE SODIUM PHOSPHATE 4 MG/ML IJ SOLN
INTRAMUSCULAR | Status: DC | PRN
  Administered 2015-08-30: 10 mg via INTRAVENOUS

## 2015-08-30 MED ORDER — ACETAMINOPHEN 500 MG PO TABS
ORAL_TABLET | ORAL | Status: AC
  Filled 2015-08-30: qty 2

## 2015-08-30 MED ORDER — SCOPOLAMINE 1 MG/3DAYS TD PT72: 1.0000 | MEDICATED_PATCH | Freq: Once | TRANSDERMAL | Status: DC | PRN

## 2015-08-30 MED ORDER — FENTANYL CITRATE (PF) 100 MCG/2ML IJ SOLN
INTRAMUSCULAR | Status: AC
  Filled 2015-08-30: qty 2

## 2015-08-30 MED ORDER — ACETAMINOPHEN 500 MG PO TABS
1000.0000 mg | ORAL_TABLET | ORAL | Status: AC
  Administered 2015-08-30: 1000 mg via ORAL

## 2015-08-30 MED ORDER — LACTATED RINGERS IV SOLN
INTRAVENOUS | Status: DC
  Administered 2015-08-30: 09:00:00 via INTRAVENOUS

## 2015-08-30 MED ORDER — OXYCODONE HCL 5 MG/5ML PO SOLN: 5.0000 mg | Freq: Once | ORAL | Status: AC | PRN

## 2015-08-30 MED ORDER — LIDOCAINE 2% (20 MG/ML) 5 ML SYRINGE
INTRAMUSCULAR | Status: AC
  Filled 2015-08-30: qty 5

## 2015-08-30 MED ORDER — PHENYLEPHRINE HCL 10 MG/ML IJ SOLN
INTRAMUSCULAR | Status: DC | PRN
  Administered 2015-08-30: 80 ug via INTRAVENOUS

## 2015-08-30 MED ORDER — PROPOFOL 10 MG/ML IV BOLUS
INTRAVENOUS | Status: DC | PRN
  Administered 2015-08-30: 200 mg via INTRAVENOUS

## 2015-08-30 MED ORDER — MIDAZOLAM HCL 2 MG/2ML IJ SOLN: 1.0000 mg | INTRAMUSCULAR | Status: DC | PRN

## 2015-08-30 MED ORDER — 0.9 % SODIUM CHLORIDE (POUR BTL) OPTIME
TOPICAL | Status: DC | PRN
  Administered 2015-08-30: 400 mL

## 2015-08-30 MED ORDER — CHLORHEXIDINE GLUCONATE CLOTH 2 % EX PADS: 6.0000 | MEDICATED_PAD | Freq: Once | CUTANEOUS | Status: DC

## 2015-08-30 MED ORDER — ONDANSETRON HCL 4 MG/2ML IJ SOLN
INTRAMUSCULAR | Status: AC
  Filled 2015-08-30: qty 2

## 2015-08-30 MED ORDER — CEFAZOLIN SODIUM 10 G IJ SOLR
3.0000 g | INTRAMUSCULAR | Status: AC
  Administered 2015-08-30: 3 g via INTRAVENOUS

## 2015-08-30 MED ORDER — PROPOFOL 10 MG/ML IV BOLUS
INTRAVENOUS | Status: AC
  Filled 2015-08-30: qty 40

## 2015-08-30 MED ORDER — HYDROMORPHONE HCL 1 MG/ML IJ SOLN: 0.2500 mg | INTRAMUSCULAR | Status: DC | PRN

## 2015-08-30 MED ORDER — EPHEDRINE SULFATE 50 MG/ML IJ SOLN
INTRAMUSCULAR | Status: DC | PRN
  Administered 2015-08-30 (×3): 10 mg via INTRAVENOUS

## 2015-08-30 MED ORDER — OXYCODONE HCL 5 MG PO TABS
5.0000 mg | ORAL_TABLET | Freq: Once | ORAL | Status: AC | PRN
  Administered 2015-08-30: 5 mg via ORAL

## 2015-08-30 MED ORDER — GABAPENTIN 300 MG PO CAPS
ORAL_CAPSULE | ORAL | Status: AC
  Filled 2015-08-30: qty 1

## 2015-08-30 MED ORDER — OXYCODONE-ACETAMINOPHEN 5-325 MG PO TABS: 1.0000 | ORAL_TABLET | ORAL | Status: DC | PRN

## 2015-08-30 MED ORDER — MEPERIDINE HCL 25 MG/ML IJ SOLN: 6.2500 mg | INTRAMUSCULAR | Status: DC | PRN

## 2015-08-30 MED ORDER — DEXAMETHASONE SODIUM PHOSPHATE 10 MG/ML IJ SOLN
INTRAMUSCULAR | Status: AC
  Filled 2015-08-30: qty 1

## 2015-08-30 MED ORDER — OXYCODONE HCL 5 MG PO TABS
ORAL_TABLET | ORAL | Status: AC
  Filled 2015-08-30: qty 1

## 2015-08-30 MED ORDER — CEFAZOLIN SODIUM-DEXTROSE 2-4 GM/100ML-% IV SOLN
INTRAVENOUS | Status: AC
  Filled 2015-08-30: qty 100

## 2015-08-30 MED ORDER — EPHEDRINE 5 MG/ML INJ
INTRAVENOUS | Status: AC
  Filled 2015-08-30: qty 10

## 2015-08-30 MED ORDER — ONDANSETRON HCL 4 MG/2ML IJ SOLN
INTRAMUSCULAR | Status: DC | PRN
  Administered 2015-08-30: 4 mg via INTRAVENOUS

## 2015-08-30 MED ORDER — FENTANYL CITRATE (PF) 100 MCG/2ML IJ SOLN
INTRAMUSCULAR | Status: DC | PRN
  Administered 2015-08-30 (×2): 50 ug via INTRAVENOUS

## 2015-08-30 MED ORDER — BUPIVACAINE-EPINEPHRINE 0.25% -1:200000 IJ SOLN
INTRAMUSCULAR | Status: DC | PRN
  Administered 2015-08-30: 10 mL

## 2015-08-30 MED ORDER — FENTANYL CITRATE (PF) 100 MCG/2ML IJ SOLN: 50.0000 ug | INTRAMUSCULAR | Status: DC | PRN

## 2015-08-30 MED ORDER — GABAPENTIN 300 MG PO CAPS
300.0000 mg | ORAL_CAPSULE | ORAL | Status: AC
  Administered 2015-08-30: 300 mg via ORAL

## 2015-08-30 SURGICAL SUPPLY — 41 items
APPLIER CLIP 9.375 MED OPEN (MISCELLANEOUS) ×3
APR CLP MED 9.3 20 MLT OPN (MISCELLANEOUS) ×1
BINDER BREAST LRG (GAUZE/BANDAGES/DRESSINGS) IMPLANT
BINDER BREAST XLRG (GAUZE/BANDAGES/DRESSINGS) IMPLANT
BLADE SURG 15 STRL LF DISP TIS (BLADE) ×1 IMPLANT
BLADE SURG 15 STRL SS (BLADE) ×3
CANISTER SUCT 1200ML W/VALVE (MISCELLANEOUS) ×3 IMPLANT
CHLORAPREP W/TINT 26ML (MISCELLANEOUS) ×3 IMPLANT
CLIP APPLIE 9.375 MED OPEN (MISCELLANEOUS) IMPLANT
COVER BACK TABLE 60X90IN (DRAPES) ×3 IMPLANT
COVER MAYO STAND STRL (DRAPES) ×3 IMPLANT
DRAPE LAPAROTOMY 100X72 PEDS (DRAPES) ×3 IMPLANT
DRAPE UTILITY XL STRL (DRAPES) ×3 IMPLANT
ELECT COATED BLADE 2.86 ST (ELECTRODE) ×3 IMPLANT
ELECT REM PT RETURN 9FT ADLT (ELECTROSURGICAL) ×3
ELECTRODE REM PT RTRN 9FT ADLT (ELECTROSURGICAL) ×1 IMPLANT
GLOVE BIOGEL PI IND STRL 7.0 (GLOVE) IMPLANT
GLOVE BIOGEL PI IND STRL 8 (GLOVE) ×1 IMPLANT
GLOVE BIOGEL PI INDICATOR 7.0 (GLOVE) ×4
GLOVE BIOGEL PI INDICATOR 8 (GLOVE) ×2
GLOVE ECLIPSE 6.5 STRL STRAW (GLOVE) ×4 IMPLANT
GLOVE ECLIPSE 8.0 STRL XLNG CF (GLOVE) ×3 IMPLANT
GOWN STRL REUS W/ TWL LRG LVL3 (GOWN DISPOSABLE) ×2 IMPLANT
GOWN STRL REUS W/TWL LRG LVL3 (GOWN DISPOSABLE) ×6
KIT MARKER MARGIN INK (KITS) ×2 IMPLANT
LIQUID BAND (GAUZE/BANDAGES/DRESSINGS) ×3 IMPLANT
NDL HYPO 25X1 1.5 SAFETY (NEEDLE) ×1 IMPLANT
NEEDLE HYPO 25X1 1.5 SAFETY (NEEDLE) ×3 IMPLANT
NS IRRIG 1000ML POUR BTL (IV SOLUTION) ×3 IMPLANT
PACK BASIN DAY SURGERY FS (CUSTOM PROCEDURE TRAY) ×3 IMPLANT
PENCIL BUTTON HOLSTER BLD 10FT (ELECTRODE) ×3 IMPLANT
SLEEVE SCD COMPRESS KNEE MED (MISCELLANEOUS) ×3 IMPLANT
SPONGE LAP 4X18 X RAY DECT (DISPOSABLE) ×3 IMPLANT
SUT MON AB 4-0 PC3 18 (SUTURE) ×3 IMPLANT
SUT VICRYL 3-0 CR8 SH (SUTURE) ×3 IMPLANT
SYR CONTROL 10ML LL (SYRINGE) ×3 IMPLANT
TOWEL OR 17X24 6PK STRL BLUE (TOWEL DISPOSABLE) ×4 IMPLANT
TOWEL OR NON WOVEN STRL DISP B (DISPOSABLE) ×3 IMPLANT
TUBE CONNECTING 20'X1/4 (TUBING) ×1
TUBE CONNECTING 20X1/4 (TUBING) ×2 IMPLANT
YANKAUER SUCT BULB TIP NO VENT (SUCTIONS) ×3 IMPLANT

## 2015-08-30 NOTE — Interval H&P Note (Signed)
History and Physical Interval Note:  08/30/2015 9:07 AM  Kiara Ruiz  has presented today for surgery, with the diagnosis of left breast cancer  The various methods of treatment have been discussed with the patient and family. After consideration of risks, benefits and other options for treatment, the patient has consented to  Procedure(s): RE-EXCISION OF BREAST LUMPECTOMY (Left) as a surgical intervention .  The patient's history has been reviewed, patient examined, no change in status, stable for surgery.  I have reviewed the patient's chart and labs.  Questions were answered to the patient's satisfaction.     Bernardette Waldron A.

## 2015-08-30 NOTE — Transfer of Care (Signed)
Immediate Anesthesia Transfer of Care Note  Patient: Kiara Ruiz  Procedure(s) Performed: Procedure(s): RE-EXCISION OF BREAST LUMPECTOMY (Left)  Patient Location: PACU  Anesthesia Type:General  Level of Consciousness: awake and patient cooperative  Airway & Oxygen Therapy: Patient Spontanous Breathing and Patient connected to face mask oxygen  Post-op Assessment: Report given to RN and Post -op Vital signs reviewed and stable  Post vital signs: Reviewed and stable  Last Vitals:  Filed Vitals:   08/30/15 0832  BP: 128/72  Pulse: 72  Temp: 36.9 C  Resp: 18    Last Pain: There were no vitals filed for this visit.       Complications: No apparent anesthesia complications

## 2015-08-30 NOTE — Anesthesia Postprocedure Evaluation (Signed)
Anesthesia Post Note  Patient: Kiara Ruiz  Procedure(s) Performed: Procedure(s) (LRB): RE-EXCISION OF BREAST LUMPECTOMY (Left)  Patient location during evaluation: PACU Anesthesia Type: General Level of consciousness: awake and alert Pain management: pain level controlled Vital Signs Assessment: post-procedure vital signs reviewed and stable Respiratory status: spontaneous breathing, nonlabored ventilation and respiratory function stable Cardiovascular status: blood pressure returned to baseline and stable Postop Assessment: no signs of nausea or vomiting Anesthetic complications: no    Last Vitals:  Filed Vitals:   08/30/15 1045 08/30/15 1110  BP:  148/79  Pulse: 76 70  Temp:  36.5 C  Resp: 17 18    Last Pain:  Filed Vitals:   08/30/15 1112  PainSc: 2                  Priscilla Finklea A

## 2015-08-30 NOTE — Anesthesia Preprocedure Evaluation (Signed)
Anesthesia Evaluation  Patient identified by MRN, date of birth, ID band Patient awake    Reviewed: Allergy & Precautions, NPO status , Patient's Chart, lab work & pertinent test results  Airway Mallampati: I  TM Distance: >3 FB Neck ROM: Full    Dental  (+) Teeth Intact, Dental Advisory Given   Pulmonary  breath sounds clear to auscultation        Cardiovascular Rhythm:Regular Rate:Normal     Neuro/Psych    GI/Hepatic GERD-  Medicated and Controlled,  Endo/Other    Renal/GU      Musculoskeletal   Abdominal   Peds  Hematology   Anesthesia Other Findings   Reproductive/Obstetrics                             Anesthesia Physical Anesthesia Plan  ASA: II  Anesthesia Plan: General   Post-op Pain Management:    Induction: Intravenous  Airway Management Planned: LMA  Additional Equipment:   Intra-op Plan:   Post-operative Plan: Extubation in OR  Informed Consent: I have reviewed the patients History and Physical, chart, labs and discussed the procedure including the risks, benefits and alternatives for the proposed anesthesia with the patient or authorized representative who has indicated his/her understanding and acceptance.   Dental advisory given  Plan Discussed with: CRNA, Anesthesiologist and Surgeon  Anesthesia Plan Comments:         Anesthesia Quick Evaluation  

## 2015-08-30 NOTE — Discharge Instructions (Signed)
[facility] °Office Phone Number [phone] ° °BREAST BIOPSY/ PARTIAL MASTECTOMY: POST OP INSTRUCTIONS ° °Always review your discharge instruction sheet given to you by the facility where your surgery was performed. ° °IF YOU HAVE DISABILITY OR FAMILY LEAVE FORMS, YOU MUST BRING THEM TO THE OFFICE FOR PROCESSING.  DO NOT GIVE THEM TO YOUR DOCTOR. ° °1. A prescription for pain medication may be given to you upon discharge.  Take your pain medication as prescribed, if needed.  If narcotic pain medicine is not needed, then you may take acetaminophen (Tylenol) or ibuprofen (Advil) as needed. °2. Take your usually prescribed medications unless otherwise directed °3. If you need a refill on your pain medication, please contact your pharmacy.  They will contact our office to request authorization.  Prescriptions will not be filled after 5pm or on week-ends. °4. You should eat very light the first 24 hours after surgery, such as soup, crackers, pudding, etc.  Resume your normal diet the day after surgery. °5. Most patients will experience some swelling and bruising in the breast.  Ice packs and a good support bra will help.  Swelling and bruising can take several days to resolve.  °6. It is common to experience some constipation if taking pain medication after surgery.  Increasing fluid intake and taking a stool softener will usually help or prevent this problem from occurring.  A mild laxative (Milk of Magnesia or Miralax) should be taken according to package directions if there are no bowel movements after 48 hours. °7. Unless discharge instructions indicate otherwise, you may remove your bandages 24-48 hours after surgery, and you may shower at that time.  You may have steri-strips (small skin tapes) in place directly over the incision.  These strips should be left on the skin for 7-10 days.  If your surgeon used skin glue on the incision, you may shower in 24 hours.  The glue will flake off over the  next 2-3 weeks.  Any sutures or staples will be removed at the office during your follow-up visit. °8. ACTIVITIES:  You may resume regular daily activities (gradually increasing) beginning the next day.  Wearing a good support bra or sports bra minimizes pain and swelling.  You may have sexual intercourse when it is comfortable. °a. You may drive when you no longer are taking prescription pain medication, you can comfortably wear a seatbelt, and you can safely maneuver your car and apply brakes. °b. RETURN TO WORK:  ______________________________________________________________________________________ °9. You should see your doctor in the office for a follow-up appointment approximately two weeks after your surgery.  Your doctor’s nurse will typically make your follow-up appointment when she calls you with your pathology report.  Expect your pathology report 2-3 business days after your surgery.  You may call to check if you do not hear from us after three days. °10. OTHER INSTRUCTIONS: _______________________________________________________________________________________________ _____________________________________________________________________________________________________________________________________ °_____________________________________________________________________________________________________________________________________ °_____________________________________________________________________________________________________________________________________ ° °WHEN TO CALL YOUR DOCTOR: °1. Fever over 101.0 °2. Nausea and/or vomiting. °3. Extreme swelling or bruising. °4. Continued bleeding from incision. °5. Increased pain, redness, or drainage from the incision. ° °The clinic staff is available to answer your questions during regular business hours.  Please don’t hesitate to call and ask to speak to one of the nurses for clinical concerns.  If you have a medical emergency, go to the nearest  emergency room or call 911.  A surgeon from [facility] is always on call at the hospital. ° °For further questions, please visit [email or web address]  ° ° ° ° °  Call Friday for results of  surgery       Post Anesthesia Home Care Instructions  Activity: Get plenty of rest for the remainder of the day. A responsible adult should stay with you for 24 hours following the procedure.  For the next 24 hours, DO NOT: -Drive a car -Paediatric nurse -Drink alcoholic beverages -Take any medication unless instructed by your physician -Make any legal decisions or sign important papers.  Meals: Start with liquid foods such as gelatin or soup. Progress to regular foods as tolerated. Avoid greasy, spicy, heavy foods. If nausea and/or vomiting occur, drink only clear liquids until the nausea and/or vomiting subsides. Call your physician if vomiting continues.  Special Instructions/Symptoms: Your throat may feel dry or sore from the anesthesia or the breathing tube placed in your throat during surgery. If this causes discomfort, gargle with warm salt water. The discomfort should disappear within 24 hours.  If you had a scopolamine patch placed behind your ear for the management of post- operative nausea and/or vomiting:  1. The medication in the patch is effective for 72 hours, after which it should be removed.  Wrap patch in a tissue and discard in the trash. Wash hands thoroughly with soap and water. 2. You may remove the patch earlier than 72 hours if you experience unpleasant side effects which may include dry mouth, dizziness or visual disturbances. 3. Avoid touching the patch. Wash your hands with soap and water after contact with the patch.

## 2015-08-30 NOTE — Anesthesia Procedure Notes (Signed)
Procedure Name: LMA Insertion Date/Time: 08/30/2015 9:33 AM Performed by: Toula Moos L Pre-anesthesia Checklist: Patient identified, Emergency Drugs available, Suction available, Patient being monitored and Timeout performed Patient Re-evaluated:Patient Re-evaluated prior to inductionOxygen Delivery Method: Circle system utilized Preoxygenation: Pre-oxygenation with 100% oxygen Intubation Type: IV induction Ventilation: Mask ventilation without difficulty LMA: LMA inserted LMA Size: 4.0 Number of attempts: 1 Airway Equipment and Method: Bite block Placement Confirmation: positive ETCO2 Tube secured with: Tape Dental Injury: Teeth and Oropharynx as per pre-operative assessment

## 2015-08-30 NOTE — H&P (Signed)
Kiara Ruiz is an 70 y.o. female.   Chief Complaint: left breast cancer  HPI: pt here for re excision of positive margins after lumpectomy earlier this month   Past Medical History  Diagnosis Date  . Osteopenia 02/2015    T score -2.0 FRAX 10%/1.8%  . Glaucoma   . ASCUS favor benign 09/2013    Negative HR HPV  . Breast cancer of lower-outer quadrant of left female breast ([place]) 07/04/2015  . Breast cancer ([place])   . GERD (gastroesophageal reflux disease)     hx of, none recently  . Arthritis     Past Surgical History  Procedure Laterality Date  . Breast surgery      BREAST BIOPSIES X 3 (BENIGN)  . Tonsillectomy    . Tubal ligation    . Eye surgery    . Cataract extraction      left eye  . Colonoscopy    . Breast lumpectomy with radioactive seed and sentinel lymph node biopsy Left 08/08/2015    Procedure: BREAST LUMPECTOMY WITH RADIOACTIVE SEED AND SENTINEL LYMPH NODE BIOPSY;  Surgeon: Erroll Luna, MD;  Location: [facility];  Service: General;  Laterality: Left;    Family History  Problem Relation Age of Onset  . Diabetes Mother   . Cancer Mother 41    PANCREATIC  . Hypertension Father   . Diabetes Brother   . Diabetes Brother    Social History:  reports that she has never smoked. She has never used smokeless tobacco. She reports that she does not drink alcohol or use illicit drugs.  Allergies: No Known Allergies  No prescriptions prior to admission    No results found for this or any previous visit (from the past 48 hour(s)). No results found.  Review of Systems  Constitutional: Negative.   HENT: Negative.     Height 5\' 1"  (1.549 m), weight 70.308 kg (155 lb). Physical Exam  Constitutional: She appears well-developed and well-nourished.  Respiratory:  Left breast incision noted   Neurological: She is alert.  Skin: Skin is warm and dry.     Assessment/Plan Left breast cancer Re excision of positive margins No need for ALND   The procedure has been  discussed with the patient. Alternatives to surgery have been discussed with the patient.  Risks of surgery include bleeding,  Infection,  Seroma formation, death,  and the need for further surgery.   The patient understands and wishes to proceed.  Yvett Rossel A., MD 08/30/2015, 8:00 AM

## 2015-08-30 NOTE — Op Note (Signed)
Preoperative diagnosis: Left breast cancer status post lumpectomy with positive margins  Postoperative diagnosis: Same  Procedure: Left breast reexcision lumpectomy  Surgeon: Erroll Luna M.D.  Anesthesia: LMA with 0.25% Sensorcaine local  EBL: Minimal  Specimens: All margins of lumpectomy cavity to pathology after orientation  Drains: None  Indications for procedure: Patient presents for reexcision left breast lumpectomy due to positive margins. We discussed the risk and the rationale for doing this to decrease her risk of potential recurrence. She agreed to proceed.The procedure has been discussed with the patient. Alternatives to surgery have been discussed with the patient.  Risks of surgery include bleeding,  Infection,  Seroma formation, death,  and the need for further surgery.   The patient understands and wishes to proceed.  Description of procedure: The patient was brought to the operating room and placed supine. This was done after the patient was seen in the holding area in the left side was marked as side. Her incision was intact. After induction of LMA anesthesia the left breast was prepped and draped in sterile fashion. Timeout was done. The old incision was opened and seroma was evacuated. All margins were excised to a depth approximately 8 mm to a centimeter. These were all oriented passed off the field. I re-clipped cavity. Of note, the posterior margin is now muscle. Irrigation used. Wound closed with 3-0 Vicryl for Monocryl. Liquid adhesive applied. All final counts found to be correct. Patient was awoke extubated taken to recovery in satisfactory condition.

## 2015-09-01 ENCOUNTER — Encounter (HOSPITAL_BASED_OUTPATIENT_CLINIC_OR_DEPARTMENT_OTHER): Payer: Self-pay | Admitting: Surgery

## 2015-09-11 ENCOUNTER — Ambulatory Visit: Payer: Self-pay | Admitting: Surgery

## 2015-09-14 ENCOUNTER — Telehealth: Payer: Self-pay | Admitting: *Deleted

## 2015-09-14 NOTE — Telephone Encounter (Signed)
Pt called in question of when her sx will be scheduled for mastectomy. Informed pt that this RN will call and speak to sx scheduler at [facility] to ensure she has all she needs. Informed pt we will get her back in to see Dr. Lindi Adie after sx to discuss further treatment plan Received verbal understanding.

## 2015-09-20 ENCOUNTER — Encounter (HOSPITAL_COMMUNITY): Payer: Self-pay

## 2015-09-20 ENCOUNTER — Encounter (HOSPITAL_COMMUNITY)
Admission: RE | Admit: 2015-09-20 | Discharge: 2015-09-20 | Disposition: A | Discharge status: Home / Self Care | Payer: Medicare Other | Source: Ambulatory Visit | Attending: Surgery | Admitting: Surgery

## 2015-09-20 ENCOUNTER — Ambulatory Visit: Payer: Medicare Other

## 2015-09-20 ENCOUNTER — Other Ambulatory Visit (HOSPITAL_COMMUNITY): Payer: Self-pay | Admitting: *Deleted

## 2015-09-20 ENCOUNTER — Ambulatory Visit: Payer: Medicare Other | Admitting: Radiation Oncology

## 2015-09-20 DIAGNOSIS — C50512 Malignant neoplasm of lower-outer quadrant of left female breast: Secondary | ICD-10-CM | POA: Diagnosis not present

## 2015-09-20 DIAGNOSIS — M8580 Other specified disorders of bone density and structure, unspecified site: Secondary | ICD-10-CM | POA: Diagnosis not present

## 2015-09-20 DIAGNOSIS — Z7982 Long term (current) use of aspirin: Secondary | ICD-10-CM | POA: Diagnosis not present

## 2015-09-20 DIAGNOSIS — Z8249 Family history of ischemic heart disease and other diseases of the circulatory system: Secondary | ICD-10-CM | POA: Diagnosis not present

## 2015-09-20 DIAGNOSIS — Z833 Family history of diabetes mellitus: Secondary | ICD-10-CM | POA: Diagnosis not present

## 2015-09-20 DIAGNOSIS — H409 Unspecified glaucoma: Secondary | ICD-10-CM | POA: Diagnosis not present

## 2015-09-20 DIAGNOSIS — C50912 Malignant neoplasm of unspecified site of left female breast: Secondary | ICD-10-CM | POA: Diagnosis present

## 2015-09-20 DIAGNOSIS — M1990 Unspecified osteoarthritis, unspecified site: Secondary | ICD-10-CM | POA: Diagnosis not present

## 2015-09-20 DIAGNOSIS — Z79899 Other long term (current) drug therapy: Secondary | ICD-10-CM | POA: Diagnosis not present

## 2015-09-20 DIAGNOSIS — Z9842 Cataract extraction status, left eye: Secondary | ICD-10-CM | POA: Diagnosis not present

## 2015-09-20 DIAGNOSIS — Z800 Family history of malignant neoplasm of digestive organs: Secondary | ICD-10-CM | POA: Diagnosis not present

## 2015-09-20 DIAGNOSIS — K219 Gastro-esophageal reflux disease without esophagitis: Secondary | ICD-10-CM | POA: Diagnosis not present

## 2015-09-20 LAB — CBC
HCT: 43.5 % (ref 36.0–46.0)
Hemoglobin: 13.8 g/dL (ref 12.0–15.0)
MCH: 30.5 pg (ref 26.0–34.0)
MCHC: 31.7 g/dL (ref 30.0–36.0)
MCV: 96.2 fL (ref 78.0–100.0)
PLATELETS: 240 10*3/uL (ref 150–400)
RBC: 4.52 MIL/uL (ref 3.87–5.11)
RDW: 12.5 % (ref 11.5–15.5)
WBC: 5.8 10*3/uL (ref 4.0–10.5)

## 2015-09-20 MED ORDER — ACETAMINOPHEN 500 MG PO TABS
1000.0000 mg | ORAL_TABLET | ORAL | Status: AC
  Administered 2015-09-21: 1000 mg via ORAL
  Filled 2015-09-20: qty 2

## 2015-09-20 MED ORDER — DEXTROSE 5 % IV SOLN
3.0000 g | INTRAVENOUS | Status: AC
  Administered 2015-09-21: 3 g via INTRAVENOUS
  Filled 2015-09-20: qty 3000

## 2015-09-20 MED ORDER — CELECOXIB 200 MG PO CAPS
400.0000 mg | ORAL_CAPSULE | ORAL | Status: AC
  Administered 2015-09-21: 400 mg via ORAL
  Filled 2015-09-20: qty 2

## 2015-09-20 MED ORDER — GABAPENTIN 300 MG PO CAPS
300.0000 mg | ORAL_CAPSULE | ORAL | Status: AC
  Administered 2015-09-21: 300 mg via ORAL
  Filled 2015-09-20: qty 1

## 2015-09-20 NOTE — Pre-Procedure Instructions (Signed)
Kiara Ruiz  09/20/2015     Your procedure is scheduled on Thursday, September 21, 2015 at 12:00 Noon.   Report to [facility] Entrance "A" Admitting Office at 10:00 AM.   Call this number if you have problems the morning of surgery: [phone]    Remember:  Do not eat food or drink liquids after midnight tonight. You may use your eye drops in the morning.    Do not wear jewelry, make-up or nail polish.  Do not wear lotions, powders, or perfumes.  You may NOT wear deoderant.  Do not shave 48 hours prior to surgery.    Do not bring valuables to the hospital.  [facility] is not responsible for any belongings or valuables.  Contacts, dentures or bridgework may not be worn into surgery.  Leave your suitcase in the car.  After surgery it may be brought to your room.  For patients admitted to the hospital, discharge time will be determined by your treatment team.  Patients discharged the day of surgery will not be allowed to drive home.   Special instructions:  [place] - Preparing for Surgery  Before surgery, you can play an important role.  Because skin is not sterile, your skin needs to be as free of germs as possible.  You can reduce the number of germs on you skin by washing with CHG (chlorahexidine gluconate) soap before surgery.  CHG is an antiseptic cleaner which kills germs and bonds with the skin to continue killing germs even after washing.  Please DO NOT use if you have an allergy to CHG or antibacterial soaps.  If your skin becomes reddened/irritated stop using the CHG and inform your nurse when you arrive at Short Stay.  Do not shave (including legs and underarms) for at least 48 hours prior to the first CHG shower.  You may shave your face.  Please follow these instructions carefully:   1.  Shower with CHG Soap the night before surgery and the                                morning of Surgery.  2.  If you choose to wash your hair, wash your hair first as  usual with your       normal shampoo.  3.  After you shampoo, rinse your hair and body thoroughly to remove the                      Shampoo.  4.  Use CHG as you would any other liquid soap.  You can apply chg directly       to the skin and wash gently with scrungie or a clean washcloth.  5.  Apply the CHG Soap to your body ONLY FROM THE NECK DOWN.        Do not use on open wounds or open sores.  Avoid contact with your eyes, ears, mouth and genitals (private parts).  Wash genitals (private parts) with your normal soap.  6.  Wash thoroughly, paying special attention to the area where your surgery        will be performed.  7.  Thoroughly rinse your body with warm water from the neck down.  8.  DO NOT shower/wash with your normal soap after using and rinsing off       the CHG Soap.  9.  Pat yourself dry  with a clean towel.            10.  Wear clean pajamas.            11.  Place clean sheets on your bed the night of your first shower and do not        sleep with pets.  Day of Surgery  Do not apply any lotions/deodorants the morning of surgery.  Please wear clean clothes to the hospital.   Please read over the following fact sheets that you were given. Pain Booklet, Coughing and Deep Breathing and Surgical Site Infection Prevention

## 2015-09-20 NOTE — Progress Notes (Signed)
Pt denies any recent chest pain or sob. No prior cardiac history.

## 2015-09-21 ENCOUNTER — Other Ambulatory Visit: Payer: Self-pay | Admitting: *Deleted

## 2015-09-21 ENCOUNTER — Encounter (HOSPITAL_COMMUNITY)
Admission: RE | Disposition: A | Discharge status: Home / Self Care | Payer: Self-pay | Source: Ambulatory Visit | Attending: Surgery

## 2015-09-21 ENCOUNTER — Ambulatory Visit (HOSPITAL_COMMUNITY): Payer: Medicare Other | Admitting: Anesthesiology

## 2015-09-21 ENCOUNTER — Observation Stay (HOSPITAL_COMMUNITY)
Admission: RE | Admit: 2015-09-21 | Discharge: 2015-09-22 | Disposition: A | Discharge status: Home / Self Care | Payer: Medicare Other | Source: Ambulatory Visit | Attending: Surgery | Admitting: Surgery

## 2015-09-21 ENCOUNTER — Encounter (HOSPITAL_COMMUNITY): Payer: Self-pay | Admitting: Anesthesiology

## 2015-09-21 DIAGNOSIS — H409 Unspecified glaucoma: Secondary | ICD-10-CM | POA: Insufficient documentation

## 2015-09-21 DIAGNOSIS — C50912 Malignant neoplasm of unspecified site of left female breast: Secondary | ICD-10-CM | POA: Diagnosis present

## 2015-09-21 DIAGNOSIS — Z9842 Cataract extraction status, left eye: Secondary | ICD-10-CM | POA: Insufficient documentation

## 2015-09-21 DIAGNOSIS — C50512 Malignant neoplasm of lower-outer quadrant of left female breast: Secondary | ICD-10-CM | POA: Diagnosis not present

## 2015-09-21 DIAGNOSIS — K219 Gastro-esophageal reflux disease without esophagitis: Secondary | ICD-10-CM | POA: Insufficient documentation

## 2015-09-21 DIAGNOSIS — Z7982 Long term (current) use of aspirin: Secondary | ICD-10-CM | POA: Insufficient documentation

## 2015-09-21 DIAGNOSIS — M8580 Other specified disorders of bone density and structure, unspecified site: Secondary | ICD-10-CM | POA: Diagnosis not present

## 2015-09-21 DIAGNOSIS — M1990 Unspecified osteoarthritis, unspecified site: Secondary | ICD-10-CM | POA: Insufficient documentation

## 2015-09-21 DIAGNOSIS — Z833 Family history of diabetes mellitus: Secondary | ICD-10-CM | POA: Insufficient documentation

## 2015-09-21 DIAGNOSIS — Z79899 Other long term (current) drug therapy: Secondary | ICD-10-CM | POA: Insufficient documentation

## 2015-09-21 DIAGNOSIS — Z800 Family history of malignant neoplasm of digestive organs: Secondary | ICD-10-CM | POA: Insufficient documentation

## 2015-09-21 DIAGNOSIS — Z8249 Family history of ischemic heart disease and other diseases of the circulatory system: Secondary | ICD-10-CM | POA: Insufficient documentation

## 2015-09-21 HISTORY — PX: SIMPLE MASTECTOMY WITH AXILLARY SENTINEL NODE BIOPSY: SHX6098

## 2015-09-21 HISTORY — DX: Unspecified glaucoma: H40.9

## 2015-09-21 HISTORY — PX: MASTECTOMY COMPLETE / SIMPLE: SUR845

## 2015-09-21 LAB — CREATININE, SERUM: CREATININE: 0.86 mg/dL (ref 0.44–1.00)

## 2015-09-21 LAB — CBC
HEMATOCRIT: 37.8 % (ref 36.0–46.0)
HEMOGLOBIN: 11.9 g/dL — AB (ref 12.0–15.0)
MCH: 29.9 pg (ref 26.0–34.0)
MCHC: 31.5 g/dL (ref 30.0–36.0)
MCV: 95 fL (ref 78.0–100.0)
Platelets: 201 10*3/uL (ref 150–400)
RBC: 3.98 MIL/uL (ref 3.87–5.11)
RDW: 12.3 % (ref 11.5–15.5)
WBC: 9.9 10*3/uL (ref 4.0–10.5)

## 2015-09-21 SURGERY — SIMPLE MASTECTOMY
Anesthesia: Regional | Site: Breast | Laterality: Left

## 2015-09-21 MED ORDER — METHOCARBAMOL 500 MG PO TABS: 500.0000 mg | ORAL_TABLET | Freq: Four times a day (QID) | ORAL | Status: DC | PRN

## 2015-09-21 MED ORDER — HYDROMORPHONE HCL 1 MG/ML IJ SOLN
0.2500 mg | INTRAMUSCULAR | Status: DC | PRN
  Administered 2015-09-21: 0.5 mg via INTRAVENOUS

## 2015-09-21 MED ORDER — EPHEDRINE 5 MG/ML INJ
INTRAVENOUS | Status: AC
  Filled 2015-09-21: qty 20

## 2015-09-21 MED ORDER — HYDROMORPHONE HCL 1 MG/ML IJ SOLN
1.0000 mg | INTRAMUSCULAR | Status: DC | PRN
  Filled 2015-09-21: qty 1

## 2015-09-21 MED ORDER — KCL IN DEXTROSE-NACL 20-5-0.9 MEQ/L-%-% IV SOLN
INTRAVENOUS | Status: DC
  Administered 2015-09-21: 22:00:00 via INTRAVENOUS
  Filled 2015-09-21: qty 1000

## 2015-09-21 MED ORDER — PHENYLEPHRINE HCL 10 MG/ML IJ SOLN
INTRAMUSCULAR | Status: DC | PRN
  Administered 2015-09-21 (×2): 80 ug via INTRAVENOUS

## 2015-09-21 MED ORDER — ONDANSETRON HCL 4 MG/2ML IJ SOLN: 4.0000 mg | Freq: Four times a day (QID) | INTRAMUSCULAR | Status: DC | PRN

## 2015-09-21 MED ORDER — OXYCODONE HCL 5 MG/5ML PO SOLN: 5.0000 mg | Freq: Once | ORAL | Status: DC | PRN

## 2015-09-21 MED ORDER — HYDROMORPHONE HCL 1 MG/ML IJ SOLN
INTRAMUSCULAR | Status: AC
  Filled 2015-09-21: qty 1

## 2015-09-21 MED ORDER — ENOXAPARIN SODIUM 40 MG/0.4ML ~~LOC~~ SOLN
40.0000 mg | SUBCUTANEOUS | Status: DC
  Administered 2015-09-22: 40 mg via SUBCUTANEOUS
  Filled 2015-09-21: qty 0.4

## 2015-09-21 MED ORDER — EPHEDRINE SULFATE 50 MG/ML IJ SOLN
INTRAMUSCULAR | Status: DC | PRN
  Administered 2015-09-21: 10 mg via INTRAVENOUS

## 2015-09-21 MED ORDER — 0.9 % SODIUM CHLORIDE (POUR BTL) OPTIME
TOPICAL | Status: DC | PRN
  Administered 2015-09-21: 1000 mL

## 2015-09-21 MED ORDER — HYDRALAZINE HCL 20 MG/ML IJ SOLN: 10.0000 mg | INTRAMUSCULAR | Status: DC | PRN

## 2015-09-21 MED ORDER — LACTATED RINGERS IV SOLN
INTRAVENOUS | Status: DC
  Administered 2015-09-21 (×3): via INTRAVENOUS

## 2015-09-21 MED ORDER — TIMOLOL MALEATE 0.5 % OP SOLN: 1.0000 [drp] | OPHTHALMIC | Status: DC

## 2015-09-21 MED ORDER — ONDANSETRON 4 MG PO TBDP: 4.0000 mg | ORAL_TABLET | Freq: Four times a day (QID) | ORAL | Status: DC | PRN

## 2015-09-21 MED ORDER — FENTANYL CITRATE (PF) 100 MCG/2ML IJ SOLN
INTRAMUSCULAR | Status: AC
  Administered 2015-09-21: 50 ug
  Filled 2015-09-21: qty 2

## 2015-09-21 MED ORDER — SIMETHICONE 80 MG PO CHEW: 40.0000 mg | CHEWABLE_TABLET | Freq: Four times a day (QID) | ORAL | Status: DC | PRN

## 2015-09-21 MED ORDER — ONDANSETRON HCL 4 MG/2ML IJ SOLN
INTRAMUSCULAR | Status: DC | PRN
  Administered 2015-09-21: 4 mg via INTRAVENOUS

## 2015-09-21 MED ORDER — OXYCODONE HCL 5 MG PO TABS
5.0000 mg | ORAL_TABLET | ORAL | Status: DC | PRN
  Administered 2015-09-21 – 2015-09-22 (×4): 10 mg via ORAL
  Filled 2015-09-21 (×4): qty 2

## 2015-09-21 MED ORDER — LIDOCAINE 2% (20 MG/ML) 5 ML SYRINGE
INTRAMUSCULAR | Status: AC
  Filled 2015-09-21: qty 5

## 2015-09-21 MED ORDER — CHLORHEXIDINE GLUCONATE CLOTH 2 % EX PADS: 6.0000 | MEDICATED_PAD | Freq: Once | CUTANEOUS | Status: DC

## 2015-09-21 MED ORDER — FENTANYL CITRATE (PF) 250 MCG/5ML IJ SOLN
INTRAMUSCULAR | Status: AC
  Filled 2015-09-21: qty 5

## 2015-09-21 MED ORDER — PHENYLEPHRINE 40 MCG/ML (10ML) SYRINGE FOR IV PUSH (FOR BLOOD PRESSURE SUPPORT)
PREFILLED_SYRINGE | INTRAVENOUS | Status: AC
  Filled 2015-09-21: qty 10

## 2015-09-21 MED ORDER — PROPOFOL 10 MG/ML IV BOLUS
INTRAVENOUS | Status: DC | PRN
  Administered 2015-09-21: 200 mg via INTRAVENOUS

## 2015-09-21 MED ORDER — FENTANYL CITRATE (PF) 100 MCG/2ML IJ SOLN
INTRAMUSCULAR | Status: DC | PRN
  Administered 2015-09-21 (×2): 50 ug via INTRAVENOUS

## 2015-09-21 MED ORDER — CHLORHEXIDINE GLUCONATE CLOTH 2 % EX PADS
6.0000 | MEDICATED_PAD | Freq: Once | CUTANEOUS | Status: DC
Start: 2015-09-21 — End: 2015-09-21

## 2015-09-21 MED ORDER — MIDAZOLAM HCL 2 MG/2ML IJ SOLN
INTRAMUSCULAR | Status: AC
  Administered 2015-09-21: 2 mg
  Filled 2015-09-21: qty 2

## 2015-09-21 MED ORDER — LIDOCAINE HCL (CARDIAC) 20 MG/ML IV SOLN
INTRAVENOUS | Status: DC | PRN
  Administered 2015-09-21: 60 mg via INTRAVENOUS

## 2015-09-21 MED ORDER — CEFAZOLIN SODIUM-DEXTROSE 2-4 GM/100ML-% IV SOLN
2.0000 g | Freq: Three times a day (TID) | INTRAVENOUS | Status: AC
  Administered 2015-09-21: 2 g via INTRAVENOUS
  Filled 2015-09-21: qty 100

## 2015-09-21 MED ORDER — BUPIVACAINE-EPINEPHRINE (PF) 0.5% -1:200000 IJ SOLN
INTRAMUSCULAR | Status: DC | PRN
  Administered 2015-09-21: 30 mL via PERINEURAL

## 2015-09-21 MED ORDER — OXYCODONE HCL 5 MG PO TABS
5.0000 mg | ORAL_TABLET | Freq: Once | ORAL | Status: DC | PRN
Start: 2015-09-21 — End: 2015-09-21

## 2015-09-21 SURGICAL SUPPLY — 44 items
ADH SKN CLS LQ APL DERMABOND (GAUZE/BANDAGES/DRESSINGS)
APPLIER CLIP 9.375 MED OPEN (MISCELLANEOUS) ×3
APR CLP MED 9.3 20 MLT OPN (MISCELLANEOUS) ×1
BINDER BREAST LRG (GAUZE/BANDAGES/DRESSINGS) ×2 IMPLANT
BINDER BREAST XLRG (GAUZE/BANDAGES/DRESSINGS) IMPLANT
BIOPATCH RED 1 DISK 7.0 (GAUZE/BANDAGES/DRESSINGS) ×1 IMPLANT
BIOPATCH RED 1IN DISK 7.0MM (GAUZE/BANDAGES/DRESSINGS) ×1
CANISTER SUCTION 2500CC (MISCELLANEOUS) ×3 IMPLANT
CHLORAPREP W/TINT 26ML (MISCELLANEOUS) ×3 IMPLANT
CLIP APPLIE 9.375 MED OPEN (MISCELLANEOUS) ×1 IMPLANT
COVER SURGICAL LIGHT HANDLE (MISCELLANEOUS) ×3 IMPLANT
DERMABOND ADHESIVE PROPEN (GAUZE/BANDAGES/DRESSINGS)
DERMABOND ADVANCED .7 DNX6 (GAUZE/BANDAGES/DRESSINGS) ×1 IMPLANT
DEVICE DISSECT PLASMABLAD 3.0S (MISCELLANEOUS) ×1 IMPLANT
DRAIN CHANNEL 19F RND (DRAIN) ×3 IMPLANT
DRAPE LAPAROSCOPIC ABDOMINAL (DRAPES) ×3 IMPLANT
DRAPE UTILITY XL STRL (DRAPES) ×6 IMPLANT
DRSG TEGADERM 4X4.75 (GAUZE/BANDAGES/DRESSINGS) ×2 IMPLANT
ELECT CAUTERY BLADE 6.4 (BLADE) ×3 IMPLANT
ELECT REM PT RETURN 9FT ADLT (ELECTROSURGICAL) ×3
ELECTRODE REM PT RTRN 9FT ADLT (ELECTROSURGICAL) ×1 IMPLANT
EVACUATOR SILICONE 100CC (DRAIN) ×3 IMPLANT
GAUZE SPONGE 4X4 12PLY STRL (GAUZE/BANDAGES/DRESSINGS) ×3 IMPLANT
GLOVE BIO SURGEON STRL SZ8 (GLOVE) ×3 IMPLANT
GLOVE BIOGEL PI IND STRL 8 (GLOVE) ×1 IMPLANT
GLOVE BIOGEL PI INDICATOR 8 (GLOVE) ×2
GOWN STRL REUS W/ TWL LRG LVL3 (GOWN DISPOSABLE) ×2 IMPLANT
GOWN STRL REUS W/ TWL XL LVL3 (GOWN DISPOSABLE) ×1 IMPLANT
GOWN STRL REUS W/TWL LRG LVL3 (GOWN DISPOSABLE) ×6
GOWN STRL REUS W/TWL XL LVL3 (GOWN DISPOSABLE) ×3
KIT BASIN OR (CUSTOM PROCEDURE TRAY) ×3 IMPLANT
KIT ROOM TURNOVER OR (KITS) ×3 IMPLANT
LIQUID BAND (GAUZE/BANDAGES/DRESSINGS) ×4 IMPLANT
NS IRRIG 1000ML POUR BTL (IV SOLUTION) ×3 IMPLANT
PACK GENERAL/GYN (CUSTOM PROCEDURE TRAY) ×3 IMPLANT
PAD ARMBOARD 7.5X6 YLW CONV (MISCELLANEOUS) ×3 IMPLANT
PLASMABLADE 3.0S (MISCELLANEOUS)
SPECIMEN JAR X LARGE (MISCELLANEOUS) ×3 IMPLANT
SPONGE LAP 18X18 X RAY DECT (DISPOSABLE) ×2 IMPLANT
SUT ETHILON 3 0 FSL (SUTURE) ×3 IMPLANT
SUT MNCRL AB 4-0 PS2 18 (SUTURE) ×3 IMPLANT
SUT VIC AB 3-0 SH 18 (SUTURE) ×3 IMPLANT
TOWEL OR 17X24 6PK STRL BLUE (TOWEL DISPOSABLE) ×3 IMPLANT
TOWEL OR 17X26 10 PK STRL BLUE (TOWEL DISPOSABLE) ×3 IMPLANT

## 2015-09-21 NOTE — Interval H&P Note (Signed)
History and Physical Interval Note:  09/21/2015 11:41 AM  Kiara Ruiz  has presented today for surgery, with the diagnosis of LEFT BREAST CANCER  The various methods of treatment have been discussed with the patient and family. After consideration of risks, benefits and other options for treatment, the patient has consented to  Procedure(s): LEFT SIMPLE MASTECTOMY (Left) as a surgical intervention .  The patient's history has been reviewed, patient examined, no change in status, stable for surgery.  I have reviewed the patient's chart and labs.  Questions were  answered to the patient's satisfaction.    The surgical and non surgical options have been discussed with the patient.  Risks of surgery include bleeding,  Infection,  Flap necrosis,  Tissue loss,  Chronic pain, death, Numbness,  And the need for additional procedures.  Reconstruction options also have been discussed with the patient as well.  The patient agrees to proceed.   [name] A.

## 2015-09-21 NOTE — Anesthesia Preprocedure Evaluation (Signed)
Anesthesia Evaluation  Patient identified by MRN, date of birth, ID band Patient awake    Reviewed: Allergy & Precautions, NPO status , Patient's Chart, lab work & pertinent test results  Airway Mallampati: II   Neck ROM: full    Dental   Pulmonary neg pulmonary ROS,    breath sounds clear to auscultation       Cardiovascular negative cardio ROS   Rhythm:regular Rate:Normal     Neuro/Psych    GI/Hepatic GERD  ,  Endo/Other    Renal/GU      Musculoskeletal  (+) Arthritis ,   Abdominal   Peds  Hematology   Anesthesia Other Findings   Reproductive/Obstetrics Breast CA.                             Anesthesia Physical Anesthesia Plan  ASA: II  Anesthesia Plan: General and Regional   Post-op Pain Management:  Regional for Post-op pain   Induction: Intravenous  Airway Management Planned: LMA  Additional Equipment:   Intra-op Plan:   Post-operative Plan:   Informed Consent: I have reviewed the patients History and Physical, chart, labs and discussed the procedure including the risks, benefits and alternatives for the proposed anesthesia with the patient or authorized representative who has indicated his/her understanding and acceptance.     Plan Discussed with: CRNA, Anesthesiologist and Surgeon  Anesthesia Plan Comments:         Anesthesia Quick Evaluation

## 2015-09-21 NOTE — Anesthesia Procedure Notes (Addendum)
Anesthesia Regional Block:  Pectoralis block  Pre-Anesthetic Checklist: ,, timeout performed, Correct Patient, Correct Site, Correct Laterality, Correct Procedure, Correct Position, site marked, Risks and benefits discussed,  Surgical consent,  Pre-op evaluation,  At surgeon's request and post-op pain management  Laterality: Left  Prep: chloraprep       Needles:  Injection technique: Single-shot  Needle Type: Echogenic Needle     Needle Length: 9cm 9 cm Needle Gauge: 21 and 21 G    Additional Needles:  Procedures: ultrasound guided (picture in chart) Pectoralis block Narrative:  Start time: 09/21/2015 11:51 AM End time: 09/21/2015 11:59 AM Injection made incrementally with aspirations every 5 mL.  Performed by: Personally  Anesthesiologist: HODIERNE, ADAM  Additional Notes: Pt tolerated the procedure well.   Procedure Name: LMA Insertion Date/Time: 09/21/2015 12:49 PM Performed by: Scheryl Darter Pre-anesthesia Checklist: Patient identified, Emergency Drugs available, Suction available and Patient being monitored Patient Re-evaluated:Patient Re-evaluated prior to inductionOxygen Delivery Method: Circle System Utilized Preoxygenation: Pre-oxygenation with 100% oxygen Intubation Type: IV induction Ventilation: Mask ventilation without difficulty LMA: LMA inserted LMA Size: 4.0 Number of attempts: 1 Airway Equipment and Method: Bite block Placement Confirmation: positive ETCO2 Tube secured with: Tape Dental Injury: Teeth and Oropharynx as per pre-operative assessment

## 2015-09-21 NOTE — H&P (View-Only) (Signed)
Kiara Ruiz is an 70 y.o. female.   Chief Complaint: left breast cancer  HPI: pt here for re excision of positive margins after lumpectomy earlier this month   Past Medical History  Diagnosis Date  . Osteopenia 02/2015    T score -2.0 FRAX 10%/1.8%  . Glaucoma   . ASCUS favor benign 09/2013    Negative HR HPV  . Breast cancer of lower-outer quadrant of left female breast ([place]) 07/04/2015  . Breast cancer ([place])   . GERD (gastroesophageal reflux disease)     hx of, none recently  . Arthritis     Past Surgical History  Procedure Laterality Date  . Breast surgery      BREAST BIOPSIES X 3 (BENIGN)  . Tonsillectomy    . Tubal ligation    . Eye surgery    . Cataract extraction      left eye  . Colonoscopy    . Breast lumpectomy with radioactive seed and sentinel lymph node biopsy Left 08/08/2015    Procedure: BREAST LUMPECTOMY WITH RADIOACTIVE SEED AND SENTINEL LYMPH NODE BIOPSY;  Surgeon: Erroll Luna, MD;  Location: [facility];  Service: General;  Laterality: Left;    Family History  Problem Relation Age of Onset  . Diabetes Mother   . Cancer Mother 30    PANCREATIC  . Hypertension Father   . Diabetes Brother   . Diabetes Brother    Social History:  reports that she has never smoked. She has never used smokeless tobacco. She reports that she does not drink alcohol or use illicit drugs.  Allergies: No Known Allergies  No prescriptions prior to admission    No results found for this or any previous visit (from the past 48 hour(s)). No results found.  Review of Systems  Constitutional: Negative.   HENT: Negative.     Height 5\' 1"  (1.549 m), weight 70.308 kg (155 lb). Physical Exam  Constitutional: She appears well-developed and well-nourished.  Respiratory:  Left breast incision noted   Neurological: She is alert.  Skin: Skin is warm and dry.     Assessment/Plan Left breast cancer Re excision of positive margins No need for ALND   The procedure has been  discussed with the patient. Alternatives to surgery have been discussed with the patient.  Risks of surgery include bleeding,  Infection,  Seroma formation, death,  and the need for further surgery.   The patient understands and wishes to proceed.  Peggye Poon A., MD 08/30/2015, 8:00 AM

## 2015-09-21 NOTE — Op Note (Signed)
Kiara Ruiz 05/26/1945 [id number] 09/21/2015   Preoperative diagnosis: Left breast cancer  Postoperative diagnosis: same  Procedure: Left simple mastectomy  Surgeon: Erroll Luna A.  Assistant surgeon:none  Anesthesia:General and Regional  Clinical History and Indications:The patient is seen in the holding area and we reviewed the plans for the procedure as noted above. We reviewed the risks and complications a final time. She had no further questions. I marked the left side as the operative side. Patient had 2 lumpectomies in the left and we were unable to clear her disease. She was referred to plastic surgery for consultation but declined reconstruction. Due to cosmetic concerns, left completion mastectomy was recommended. The surgical and non surgical options have been discussed with the patient.  Risks of surgery include bleeding,  Infection,  Flap necrosis,  Tissue loss,  Chronic pain, death, Numbness,  And the need for additional procedures.  Reconstruction options also have been discussed with the patient as well.  The patient agrees to proceed.  Description of Procedure: The patient was taken to the operating room. After satisfactory general anesthesia was obtained the timeout was done.  I outlined an elliptical incision and marked the inframammary fold and midline. The incision was made. The usual skin flaps were raised. I went to the clavicle superiorly and sternum medially and towards the latissimus laterally.     The inferior flap was then made going to the inframammary fold and out to the latissimus. The breast was then removed from the pectoralis starting medially and working laterally. When I got to the lateral aspect of the pectoralis major muscle I opened the clavipectoral fascia. I finished removing the breast from the serratus muscles.    I therefore completed the mastectomy by dividing the remaining attachments from the breast to the chest wall and latissimus but  not taking any  tissue from the axilla. The specimen was marked to orient it for the pathologist and passed off the table.  I spent several minutes irrigating and making sure everything was dry. I then placed a 52 [place] Blake drain through a small incision in the inferior flap and laid along the pectoralis muscle. It was secured with a 2-0 nylon suture.  Another irrigation and check for hemostasis was made. Everything appeared to be dry. Incision was then closed with 3-0 Vicryl and 4-0 Monocryl.  The patient tolerated the procedure well. There were no operative complications. All counts were correct. Estimated blood loss was 50 cc Sterile dressings were applied and the patient taken to the PACU in satisfactory condition.  Erroll Luna, MD, FACS 09/21/2015 1:50 PM

## 2015-09-21 NOTE — Interval H&P Note (Signed)
History and Physical Interval Note:  09/21/2015 11:47 AM  Kiara Ruiz  has presented today for surgery, with the diagnosis of LEFT BREAST CANCER  The various methods of treatment have been discussed with the patient and family. After consideration of risks, benefits and other options for treatment, the patient has consented to  Procedure(s): LEFT SIMPLE MASTECTOMY (Left) as a surgical intervention .  The patient's history has been reviewed, patient examined, no change in status, stable for surgery.  I have reviewed the patient's chart and labs.  Questions were answered to the patient's satisfaction.     Jaylun Fleener A.

## 2015-09-21 NOTE — H&P (Signed)
Kiara Ruiz is an 70 y.o. female.   Chief Complaint: left breast cancer HPI: Patient presents for left simple mastectomy after lumpectomy and reexcision could not clear her left breast cancer. Her nature clear. We discussed a third reexcision which he opted for mastectomy given the cosmetic outcome of another lumpectomy would be very poor.  Past Medical History  Diagnosis Date  . Osteopenia 02/2015    T score -2.0 FRAX 10%/1.8%  . Glaucoma   . ASCUS favor benign 09/2013    Negative HR HPV  . Breast cancer of lower-outer quadrant of left female breast ([place]) 07/04/2015  . Breast cancer ([place])   . GERD (gastroesophageal reflux disease)     hx of, none recently  . Arthritis     Past Surgical History  Procedure Laterality Date  . Breast surgery      BREAST BIOPSIES X 3 (BENIGN)  . Tonsillectomy    . Tubal ligation    . Eye surgery    . Cataract extraction      left eye  . Colonoscopy    . Breast lumpectomy with radioactive seed and sentinel lymph node biopsy Left 08/08/2015    Procedure: BREAST LUMPECTOMY WITH RADIOACTIVE SEED AND SENTINEL LYMPH NODE BIOPSY;  Surgeon: Erroll Luna, MD;  Location: [facility];  Service: General;  Laterality: Left;  . Re-excision of breast lumpectomy Left 08/30/2015    Procedure: RE-EXCISION OF BREAST LUMPECTOMY;  Surgeon: Erroll Luna, MD;  Location: [facility];  Service: General;  Laterality: Left;    Family History  Problem Relation Age of Onset  . Diabetes Mother   . Cancer Mother 51    PANCREATIC  . Hypertension Father   . Diabetes Brother   . Diabetes Brother    Social History:  reports that she has never smoked. She has never used smokeless tobacco. She reports that she does not drink alcohol or use illicit drugs.  Allergies: No Known Allergies  Medications Prior to Admission  Medication Sig Dispense Refill  . latanoprost (XALATAN) 0.005 % ophthalmic solution Place 1 drop into both eyes at bedtime.     . timolol (BETIMOL)  0.5 % ophthalmic solution Place 1 drop into both eyes every morning.     Marland Kitchen aspirin 81 MG tablet Take 81 mg by mouth daily.    Marland Kitchen CALCIUM-VITAMIN D PO Take 1 tablet by mouth daily.     . Multiple Vitamin (MULTIVITAMIN PO) Take 1 tablet by mouth daily.     Marland Kitchen oxyCODONE-acetaminophen (ROXICET) 5-325 MG tablet Take 1 tablet by mouth every 4 (four) hours as needed. (Patient not taking: Reported on 09/19/2015) 30 tablet 0    Results for orders placed or performed during the hospital encounter of 09/20/15 (from the past 48 hour(s))  CBC     Status: None   Collection Time: 09/20/15  1:31 PM  Result Value Ref Range   WBC 5.8 4.0 - 10.5 K/uL   RBC 4.52 3.87 - 5.11 MIL/uL   Hemoglobin 13.8 12.0 - 15.0 g/dL   HCT 43.5 36.0 - 46.0 %   MCV 96.2 78.0 - 100.0 fL   MCH 30.5 26.0 - 34.0 pg   MCHC 31.7 30.0 - 36.0 g/dL   RDW 12.5 11.5 - 15.5 %   Platelets 240 150 - 400 K/uL   No results found.  Review of Systems  Constitutional: Negative.   Respiratory: Negative.   Genitourinary: Negative.     Blood pressure 141/69, pulse 89, temperature 98.9 F (  37.2 C), temperature source Oral, resp. rate 18, weight 68.947 kg (152 lb), SpO2 100 %. Physical Exam  Respiratory: Effort normal.    GI: Soft.  Skin: Skin is warm.     Assessment/Plan Left breast cancer   Pt has failed re excision of positive margin from lumpectomy and has opted for simple mastectomy over 3 rd excision.  She has declined reconstruction.  She has opted for simple mastectomy. The surgical and non surgical options have been discussed with the patient.  Risks of surgery include bleeding,  Infection,  Flap necrosis,  Tissue loss,  Chronic pain, death, Numbness,  And the need for additional procedures.  Reconstruction options also have been discussed with the patient as well.  The patient agrees to proceed.  Epsie Walthall A., MD 09/21/2015, 11:43 AM

## 2015-09-21 NOTE — Transfer of Care (Signed)
Immediate Anesthesia Transfer of Care Note  Patient: Kiara Ruiz  Procedure(s) Performed: Procedure(s): LEFT SIMPLE MASTECTOMY (Left)  Patient Location: PACU  Anesthesia Type:General and Regional  Level of Consciousness: awake, alert , oriented and sedated  Airway & Oxygen Therapy: Patient Spontanous Breathing and Patient connected to nasal cannula oxygen  Post-op Assessment: Report given to RN, Post -op Vital signs reviewed and stable and Patient moving all extremities  Post vital signs: Reviewed and stable  Last Vitals:  Filed Vitals:   09/21/15 1155 09/21/15 1200  BP:    Pulse: 84 81  Temp:    Resp: 22 17    Last Pain: There were no vitals filed for this visit.       Complications: No apparent anesthesia complications

## 2015-09-22 ENCOUNTER — Encounter (HOSPITAL_COMMUNITY): Payer: Self-pay | Admitting: Surgery

## 2015-09-22 DIAGNOSIS — C50512 Malignant neoplasm of lower-outer quadrant of left female breast: Secondary | ICD-10-CM | POA: Diagnosis not present

## 2015-09-22 LAB — CBC
HEMATOCRIT: 35.7 % — AB (ref 36.0–46.0)
Hemoglobin: 11.2 g/dL — ABNORMAL LOW (ref 12.0–15.0)
MCH: 29.9 pg (ref 26.0–34.0)
MCHC: 31.4 g/dL (ref 30.0–36.0)
MCV: 95.5 fL (ref 78.0–100.0)
PLATELETS: 193 10*3/uL (ref 150–400)
RBC: 3.74 MIL/uL — ABNORMAL LOW (ref 3.87–5.11)
RDW: 12.6 % (ref 11.5–15.5)
WBC: 9 10*3/uL (ref 4.0–10.5)

## 2015-09-22 NOTE — Anesthesia Postprocedure Evaluation (Signed)
Anesthesia Post Note  Patient: Kiara Ruiz  Procedure(s) Performed: Procedure(s) (LRB): LEFT SIMPLE MASTECTOMY (Left)  Patient location during evaluation: PACU Anesthesia Type: General Level of consciousness: sedated and patient cooperative Pain management: pain level controlled Vital Signs Assessment: post-procedure vital signs reviewed and stable Respiratory status: spontaneous breathing Cardiovascular status: stable Anesthetic complications: no    Last Vitals:  Filed Vitals:   09/22/15 0204 09/22/15 0503  BP: 116/68 108/62  Pulse: 78 79  Temp: 36.9 C 36.4 C  Resp: 15 15    Last Pain:  Filed Vitals:   09/22/15 0652  PainSc: 0-No pain                 Nolon Nations

## 2015-09-22 NOTE — Progress Notes (Signed)
1 Day Post-Op  Subjective: Pt doing well No complaints  Objective: Vital signs in last 24 hours: Temp:  [97.6 F (36.4 C)-98.9 F (37.2 C)] 97.6 F (36.4 C) (07/21 0503) Pulse Rate:  [71-91] 79 (07/21 0503) Resp:  [11-22] 15 (07/21 0503) BP: (108-154)/(62-105) 108/62 mmHg (07/21 0503) SpO2:  [97 %-100 %] 98 % (07/21 0503) Weight:  [68.947 kg (152 lb)-69.582 kg (153 lb 6.4 oz)] 69.582 kg (153 lb 6.4 oz) (07/20 1625) Last BM Date: 09/21/15  Intake/Output from previous day: 07/20 0701 - 07/21 0700 In: 1120 [P.O.:120; I.V.:1000] Out: 1130 [Urine:850; Drains:180; Blood:100] Intake/Output this shift:    Wound clean  No hematoma flaps viable   Lab Results:   Recent Labs  09/21/15 1845 09/22/15 0211  WBC 9.9 9.0  HGB 11.9* 11.2*  HCT 37.8 35.7*  PLT 201 193   BMET  Recent Labs  09/21/15 1845  CREATININE 0.86   PT/INR No results for input(s): LABPROT, INR in the last 72 hours. ABG No results for input(s): PHART, HCO3 in the last 72 hours.  Invalid input(s): PCO2, PO2  Studies/Results: No results found.  Anti-infectives: Anti-infectives    Start     Dose/Rate Route Frequency Ordered Stop   09/21/15 2100  ceFAZolin (ANCEF) IVPB 2g/100 mL premix     2 g 200 mL/hr over 30 Minutes Intravenous Every 8 hours 09/21/15 1726 09/21/15 2159   09/21/15 0600  ceFAZolin (ANCEF) 3 g in dextrose 5 % 50 mL IVPB     3 g 130 mL/hr over 30 Minutes Intravenous On call to O.R. 09/20/15 1334 09/21/15 1251      Assessment/Plan: s/p Procedure(s): LEFT SIMPLE MASTECTOMY (Left) Discharge     Haeli Gerlich A. 09/22/2015

## 2015-09-22 NOTE — Progress Notes (Signed)
Patient's IV removed. Patient instructed on emptying JP drain. Discharge paperwork reviewed. Prescription given. Patient ready to discharge.

## 2015-09-22 NOTE — Discharge Instructions (Signed)
[facility] [place] °[phone] ° °MASTECTOMY: POST OP INSTRUCTIONS ° °Always review your discharge instruction sheet given to you by the facility where your surgery was performed. °IF YOU HAVE DISABILITY OR FAMILY LEAVE FORMS, YOU MUST BRING THEM TO THE OFFICE FOR PROCESSING.   °DO NOT GIVE THEM TO YOUR DOCTOR. °A prescription for pain medication may be given to you upon discharge.  Take your pain medication as prescribed, if needed.  If narcotic pain medicine is not needed, then you may take acetaminophen (Tylenol) or ibuprofen (Advil) as needed. °1. Take your usually prescribed medications unless otherwise directed. °2. If you need a refill on your pain medication, please contact your pharmacy.  They will contact our office to request authorization.  Prescriptions will not be filled after 5pm or on week-ends. °3. You should follow a light diet the first few days after arrival home, such as soup and crackers, etc.  Resume your normal diet the day after surgery. °4. Most patients will experience some swelling and bruising on the chest and underarm.  Ice packs will help.  Swelling and bruising can take several days to resolve.  °5. It is common to experience some constipation if taking pain medication after surgery.  Increasing fluid intake and taking a stool softener (such as Colace) will usually help or prevent this problem from occurring.  A mild laxative (Milk of Magnesia or Miralax) should be taken according to package instructions if there are no bowel movements after 48 hours. °6. Unless discharge instructions indicate otherwise, leave your bandage dry and in place until your next appointment in 3-5 days.  You may take a limited sponge bath.  No tube baths or showers until the drains are removed.  You may have steri-strips (small skin tapes) in place directly over the incision.  These strips should be left on the skin for 7-10 days.  If your surgeon used skin glue on the incision, you may  shower in 24 hours.  The glue will flake off over the next 2-3 weeks.  Any sutures or staples will be removed at the office during your follow-up visit. °7. DRAINS:  If you have drains in place, it is important to keep a list of the amount of drainage produced each day in your drains.  Before leaving the hospital, you should be instructed on drain care.  Call our office if you have any questions about your drains. °8. ACTIVITIES:  You may resume regular (light) daily activities beginning the next day--such as daily self-care, walking, climbing stairs--gradually increasing activities as tolerated.  You may have sexual intercourse when it is comfortable.  Refrain from any heavy lifting or straining until approved by your doctor. °a. You may drive when you are no longer taking prescription pain medication, you can comfortably wear a seatbelt, and you can safely maneuver your car and apply brakes. °b. RETURN TO WORK:  __________________________________________________________ °9. You should see your doctor in the office for a follow-up appointment approximately 3-5 days after your surgery.  Your doctor’s nurse will typically make your follow-up appointment when she calls you with your pathology report.  Expect your pathology report 2-3 business days after your surgery.  You may call to check if you do not hear from us after three days.   °10. OTHER INSTRUCTIONS: ______________________________________________________________________________________________ ____________________________________________________________________________________________ °WHEN TO CALL YOUR DOCTOR: °1. Fever over 101.0 °2. Nausea and/or vomiting °3. Extreme swelling or bruising °4. Continued bleeding from incision. °5. Increased pain, redness, or drainage from the incision. °  The clinic staff is available to answer your questions during regular business hours.  Please don’t hesitate to call and ask to speak to one of the nurses for clinical  concerns.  If you have a medical emergency, go to the nearest emergency room or call 911.  A surgeon from [facility] is always on call at the hospital. °[address], [address], [place], [place]  [address] ? [address] [address], [place], [place]   [address] °[phone] ? [phone] ? FAX [phone] °Web site: www.[facility] ° ° °.Bulb Drain Home Care °A bulb drain consists of a thin rubber tube and a soft, round bulb that creates a gentle suction. The rubber tube is placed in the area where you had surgery. A bulb is attached to the end of the tube that is outside the body. The bulb drain removes excess fluid that normally builds up in a surgical wound after surgery. The color and amount of fluid will vary. Immediately after surgery, the fluid is bright red and is a little thicker than water. It may gradually change to a yellow or pink color and become more thin and water-like. When the amount decreases to about 1 or 2 tbsp in 24 hours, your health care provider will usually remove it. °DAILY CARE °· Keep the bulb flat (compressed) at all times, except while emptying it. The flatness creates suction. You can flatten the bulb by squeezing it firmly in the middle and then closing the cap. °· Keep sites where the tube enters the skin dry and covered with a bandage (dressing). °· Secure the tube 1-2 in (2.5-5.1 cm) below the insertion sites to keep it from pulling on your stitches. The tube is stitched in place and will not slip out. °· Secure the bulb as directed by your health care provider. °· For the first 3 days after surgery, there usually is more fluid in the bulb. Empty the bulb whenever it becomes half full because the bulb does not create enough suction if it is too full. The bulb could also overflow. Write down how much fluid you remove each time you empty your drain. Add up the amount removed in 24 hours. °· Empty the bulb at the same time every day once the amount of fluid decreases and you  only need to empty it once a day. Write down the amounts and the 24-hour totals to give to your health care provider. This helps your health care provider know when the tubes can be removed. °EMPTYING THE BULB DRAIN °Before emptying the bulb, get a measuring cup, a piece of paper and a pen, and wash your hands. °· Gently run your fingers down the tube (stripping) to empty any drainage from the tubing into the bulb. This may need to be done several times a day to clear the tubing of clots and tissue. °· Open the bulb cap to release suction, which causes it to inflate. Do not touch the inside of the cap. °· Gently run your fingers down the tube (stripping) to empty any drainage from the tubing into the bulb. °· Hold the cap out of the way, and pour fluid into the measuring cup.   °· Squeeze the bulb to provide suction.  °· Replace the cap.   °· Check the tape that holds the tube to your skin. If it is becoming loose, you can remove the loose piece of tape and apply a new one. Then, pin the bulb to your shirt.   °· Write down   down the amount of fluid you emptied out. Write down the date and each time you emptied your bulb drain. (If there are 2 bulbs, note the amount of drainage from each bulb and keep the totals separate. Your health care provider will want to know the total amounts for each drain and which tube is draining more.)   Flush the fluid down the toilet and wash your hands.   Call your health care provider once you have less than 2 tbsp of fluid collecting in the bulb drain every 24 hours. If there is drainage around the tube site, change dressings and keep the area dry. Cleanse around tube with sterile saline and place dry gauze around site. This gauze should be changed when it is soiled. If it stays clean and unsoiled, it should still be changed daily.  SEEK MEDICAL CARE IF:  Your drainage has a bad smell or is cloudy.   You have a fever.   Your drainage is increasing instead of decreasing.    Your tube fell out.   You have redness or swelling around the tube site.   You have drainage from a surgical wound.   Your bulb drain will not stay flat after you empty it.  MAKE SURE YOU:   Understand these instructions.  Will watch your condition.  Will get help right away if you are not doing well or get worse.   This information is not intended to replace advice given to you by your health care provider. Make sure you discuss any questions you have with your health care provider.   Document Released: 02/16/2000 Document Revised: 03/11/2014 Document Reviewed: 09/07/2014 Elsevier Interactive Patient Education [employer].

## 2015-09-22 NOTE — Discharge Summary (Signed)
Physician Discharge Summary  Patient ID: Kiara Ruiz MRN: :[id number] DOB/AGE: 1946-01-12 70 y.o.  Admit date: 09/21/2015 Discharge date: 09/22/2015  Admission Diagnoses:LEFT BREAST CANCER  Discharge Diagnoses:  Active Problems:   Breast cancer, left [facility])   Discharged Condition: good  Hospital Course: UNREMARKABLE. Pt tolerating diet,ambulating and with good pain control at discharge.  Consults: None  Significant Diagnostic Studies: labs:  CBC    Component Value Date/Time   WBC 9.0 09/22/2015 0211   WBC 6.3 07/12/2015 0826   RBC 3.74* 09/22/2015 0211   RBC 4.51 07/12/2015 0826   HGB 11.2* 09/22/2015 0211   HGB 13.7 07/12/2015 0826   HCT 35.7* 09/22/2015 0211   HCT 42.6 07/12/2015 0826   PLT 193 09/22/2015 0211   PLT 222 07/12/2015 0826   MCV 95.5 09/22/2015 0211   MCV 94.5 07/12/2015 0826   MCH 29.9 09/22/2015 0211   MCH 30.4 07/12/2015 0826   MCHC 31.4 09/22/2015 0211   MCHC 32.2 07/12/2015 0826   RDW 12.6 09/22/2015 0211   RDW 12.7 07/12/2015 0826   LYMPHSABS 1.7 07/12/2015 0826   MONOABS 0.6 07/12/2015 0826   EOSABS 0.2 07/12/2015 0826   BASOSABS 0.0 07/12/2015 0826     Treatments: surgery: left simple mastectomy  Discharge Exam: Blood pressure 108/62, pulse 79, temperature 97.6 F (36.4 C), temperature source Oral, resp. rate 15, height 5\' 1"  (1.549 m), weight 69.582 kg (153 lb 6.4 oz), SpO2 98 %. Incision/Wound:CDI no hematoma  JP serous  Flaps viable   Disposition: 01-Home or Self Care  Discharge Instructions    Call MD for:  persistant nausea and vomiting    Complete by:  As directed      Call MD for:  redness, tenderness, or signs of infection (pain, swelling, redness, odor or green/yellow discharge around incision site)    Complete by:  As directed      Call MD for:  severe uncontrolled pain    Complete by:  As directed      Call MD for:  temperature >100.4    Complete by:  As directed      Diet - low sodium heart healthy    Complete by:   As directed      Increase activity slowly    Complete by:  As directed             Medication List    TAKE these medications        aspirin 81 MG tablet  Take 81 mg by mouth daily.     CALCIUM-VITAMIN D PO  Take 1 tablet by mouth daily.     latanoprost 0.005 % ophthalmic solution  Commonly known as:  XALATAN  Place 1 drop into both eyes at bedtime.     MULTIVITAMIN PO  Take 1 tablet by mouth daily.     oxyCODONE-acetaminophen 5-325 MG tablet  Commonly known as:  ROXICET  Take 1 tablet by mouth every 4 (four) hours as needed.     timolol 0.5 % ophthalmic solution  Commonly known as:  BETIMOL  Place 1 drop into both eyes every morning.           Follow-up Information    Follow up with Bless Belshe A., MD In 10 days.   Specialty:  General Surgery   Contact information:   [address] [place] [address] [phone]       Signed: Turner Daniels. 09/22/2015, 8:58 AM

## 2015-09-23 ENCOUNTER — Telehealth: Payer: Self-pay | Admitting: Hematology and Oncology

## 2015-09-23 NOTE — Telephone Encounter (Signed)
Not able to reach patient by phone re 7/28 f/u. Schedule mailed.

## 2015-09-29 ENCOUNTER — Encounter: Payer: Self-pay | Admitting: Hematology and Oncology

## 2015-09-29 ENCOUNTER — Ambulatory Visit (HOSPITAL_BASED_OUTPATIENT_CLINIC_OR_DEPARTMENT_OTHER): Payer: Medicare Other | Admitting: Hematology and Oncology

## 2015-09-29 DIAGNOSIS — C50512 Malignant neoplasm of lower-outer quadrant of left female breast: Secondary | ICD-10-CM | POA: Diagnosis not present

## 2015-09-29 MED ORDER — ANASTROZOLE 1 MG PO TABS: 1.0000 mg | ORAL_TABLET | Freq: Every day | ORAL | 3 refills | Status: DC

## 2015-09-29 NOTE — Assessment & Plan Note (Signed)
Left lumpectomy 08/08/2015: Invasive lobular carcinoma, grade 2, 3.1 cm, broad-based was in the posterior, and inferior margin, LCIS, perineural invasion present, no LV I, ER 80%, PR 0%, HER-2 negative, Ki-67 15%, T2 N1 a stage IIB Left mastectomy 09/21/2015: Residual invasive lobular cancer grade 2, perineural invasion, ALH Mammaprint: Low risk, luminal A, 10% risk of recurrence in 10 years  Recommendation: Adjuvant antiestrogen therapy with anastrozole 1 mg by mouth daily 5-10 years Anastrozole counseling:We discussed the risks and benefits of anti-estrogen therapy with aromatase inhibitors. These include but not limited to insomnia, hot flashes, mood changes, vaginal dryness, bone density loss, and weight gain. We strongly believe that the benefits far outweigh the risks. Patient understands these risks and consented to starting treatment. Planned treatment duration is 5 years.  Return to clinic in 3 months for follow-up and toxicity evaluation on anastrozole.

## 2015-09-29 NOTE — Progress Notes (Signed)
Patient Care Team: Lucianne Lei, MD as PCP - General (Family Medicine)  DIAGNOSIS: Breast cancer of lower-outer quadrant of left female breast [facility])   Staging form: Breast, AJCC 7th Edition   - Clinical stage from 07/12/2015: Stage IIA (T2, N0, M0) - Unsigned         Staging comments: Staged at breast conference on 5.10.17  SUMMARY OF ONCOLOGIC HISTORY:   Breast cancer of lower-outer quadrant of left female breast ([place])   06/29/2015 Initial Diagnosis    Left breast biopsy LOQ: invasive lobular carcinoma, grade 1, ER 80%, PR 0%, Ki-67 15%, HER-2 negative ratio 1.24; screening detected distortion without ultrasound correlate      07/10/2015 Breast MRI    Left breast posterior lower outer quadrant: Irregular mass and NME 4.3 x 2.3 x 2.2 cm, no lymph nodes, T2 N0 stage II a clinical stage     08/08/2015 Surgery    Left lumpectomy: Invasive lobular carcinoma, grade 2, 3.1 cm, broad-based was in the posterior, and inferior margin, LCIS, perineural invasion present, no LV I, ER 80%, PR 0%, HER-2 negative, Ki-67 15%, T2 N1 a stage IIB     08/08/2015 Miscellaneous    Mammaprint: Low risk, luminal type a, 10 year risk of recurrence 10%     09/21/2015 Surgery    Left mastectomy: Invasive lobular cancer, grade 2, ALH, PNI present,       CHIEF COMPLIANT: Follow-up after recent mastectomy  INTERVAL HISTORY: Kiara Ruiz is a 70 year old with above-mentioned history invasive lobular carcinoma of left breast treated initially with lumpectomy but it had positive margins so she required mastectomy on 09/21/2015. Mastectomy showed some residual invasive lobular cancer. She is healing from the surgery quite well. She does have moderate degree of pain. Mammaprint was done on admission lumpectomy and it came back as low risk and that she does not need any systemic chemotherapy.  REVIEW OF SYSTEMS:   Constitutional: Denies fevers, chills or abnormal weight loss Eyes: Denies blurriness of vision Ears, nose,  mouth, throat, and face: Denies mucositis or sore throat Respiratory: Denies cough, dyspnea or wheezes Cardiovascular: Denies palpitation, chest discomfort Gastrointestinal:  Denies nausea, heartburn or change in bowel habits Skin: Denies abnormal skin rashes Lymphatics: Denies new lymphadenopathy or easy bruising Neurological:Denies numbness, tingling or new weaknesses Behavioral/Psych: Mood is stable, no new changes  Extremities: No lower extremity edema Breast: Recent left mastectomy complaining of pain and discomfort All other systems were reviewed with the patient and are negative.  I have reviewed the past medical history, past surgical history, social history and family history with the patient and they are unchanged from previous note.  ALLERGIES:  has No Known Allergies.  MEDICATIONS:  Current Outpatient Prescriptions  Medication Sig Dispense Refill  . aspirin 81 MG tablet Take 81 mg by mouth daily.    Marland Kitchen CALCIUM-VITAMIN D PO Take 1 tablet by mouth daily.     Marland Kitchen latanoprost (XALATAN) 0.005 % ophthalmic solution Place 1 drop into both eyes at bedtime.     . Multiple Vitamin (MULTIVITAMIN PO) Take 1 tablet by mouth daily.     Marland Kitchen oxyCODONE-acetaminophen (ROXICET) 5-325 MG tablet Take 1 tablet by mouth every 4 (four) hours as needed. (Patient not taking: Reported on 09/19/2015) 30 tablet 0  . timolol (BETIMOL) 0.5 % ophthalmic solution Place 1 drop into both eyes every morning.      No current facility-administered medications for this visit.     PHYSICAL EXAMINATION: ECOG PERFORMANCE STATUS: 1 -  Symptomatic but completely ambulatory  Vitals:   09/29/15 0814  BP: (!) 143/80  Pulse: 81  Resp: 18  Temp: 98.7 F (37.1 C)   Filed Weights   09/29/15 0814  Weight: 149 lb (67.6 kg)    GENERAL:alert, no distress and comfortable SKIN: skin color, texture, turgor are normal, no rashes or significant lesions EYES: normal, Conjunctiva are pink and non-injected, sclera  clear OROPHARYNX:no exudate, no erythema and lips, buccal mucosa, and tongue normal  NECK: supple, thyroid normal size, non-tender, without nodularity LYMPH:  no palpable lymphadenopathy in the cervical, axillary or inguinal LUNGS: clear to auscultation and percussion with normal breathing effort HEART: regular rate & rhythm and no murmurs and no lower extremity edema ABDOMEN:abdomen soft, non-tender and normal bowel sounds MUSCULOSKELETAL:no cyanosis of digits and no clubbing  NEURO: alert & oriented x 3 with fluent speech, no focal motor/sensory deficits EXTREMITIES: No lower extremity edema  LABORATORY DATA:  I have reviewed the data as listed   Chemistry      Component Value Date/Time   NA 142 07/12/2015 0826   K 4.4 07/12/2015 0826   CO2 28 07/12/2015 0826   BUN 11.2 07/12/2015 0826   CREATININE 0.86 09/21/2015 1845   CREATININE 0.9 07/12/2015 0826      Component Value Date/Time   CALCIUM 9.7 07/12/2015 0826   ALKPHOS 73 07/12/2015 0826   AST 15 07/12/2015 0826   ALT 13 07/12/2015 0826   BILITOT 0.50 07/12/2015 0826       Lab Results  Component Value Date   WBC 9.0 09/22/2015   HGB 11.2 (L) 09/22/2015   HCT 35.7 (L) 09/22/2015   MCV 95.5 09/22/2015   PLT 193 09/22/2015   NEUTROABS 3.7 07/12/2015     ASSESSMENT & PLAN:  Breast cancer of lower-outer quadrant of left female breast ([place]) Left lumpectomy 08/08/2015: Invasive lobular carcinoma, grade 2, 3.1 cm, broad-based was in the posterior, and inferior margin, LCIS, perineural invasion present, no LV I, ER 80%, PR 0%, HER-2 negative, Ki-67 15%, T2 N1 a stage IIB Left mastectomy 09/21/2015: Residual invasive lobular cancer grade 2, perineural invasion, ALH Mammaprint: Low risk, luminal A, 10% risk of recurrence in 10 years  Recommendation: Adjuvant antiestrogen therapy with anastrozole 1 mg by mouth daily 5-10 years Anastrozole counseling:We discussed the risks and benefits of anti-estrogen therapy with  aromatase inhibitors. These include but not limited to insomnia, hot flashes, mood changes, vaginal dryness, bone density loss, and weight gain. We strongly believe that the benefits far outweigh the risks. Patient understands these risks and consented to starting treatment. Planned treatment duration is 5 years.  She will start antiestrogen therapy 10/22/2015.  Return to clinic in 4 months for follow-up and toxicity evaluation on anastrozole.  No orders of the defined types were placed in this encounter.  The patient has a good understanding of the overall plan. she agrees with it. she will call with any problems that may develop before the next visit here.   Rulon Eisenmenger, MD 09/29/15

## 2015-10-02 ENCOUNTER — Encounter: Payer: Self-pay | Admitting: Hematology and Oncology

## 2015-10-02 ENCOUNTER — Other Ambulatory Visit: Payer: Self-pay | Admitting: *Deleted

## 2015-10-02 ENCOUNTER — Ambulatory Visit: Payer: Medicare Other | Admitting: Radiation Oncology

## 2015-10-02 ENCOUNTER — Ambulatory Visit
Admission: RE | Admit: 2015-10-02 | Discharge: 2015-10-02 | Disposition: A | Discharge status: Home / Self Care | Payer: Medicare Other | Source: Ambulatory Visit | Attending: Radiation Oncology | Admitting: Radiation Oncology

## 2015-10-02 ENCOUNTER — Institutional Professional Consult (permissible substitution): Payer: Medicare Other | Admitting: Radiation Oncology

## 2015-10-02 DIAGNOSIS — C50512 Malignant neoplasm of lower-outer quadrant of left female breast: Secondary | ICD-10-CM

## 2015-10-02 NOTE — Progress Notes (Signed)
form left in box 10/02/15

## 2015-10-04 ENCOUNTER — Encounter: Payer: Self-pay | Admitting: Hematology and Oncology

## 2015-10-04 NOTE — Progress Notes (Signed)
form left in box 10/02/15- left for dr. Lindi Adie to sign

## 2015-10-06 ENCOUNTER — Encounter: Payer: Self-pay | Admitting: Hematology and Oncology

## 2015-10-06 NOTE — Progress Notes (Signed)
form left in box 10/02/15- left for dr. Lindi Adie to sign- per patient mail-10/06/15 and sent to medical recfds

## 2015-11-15 ENCOUNTER — Encounter (HOSPITAL_COMMUNITY): Payer: Self-pay

## 2016-01-29 NOTE — Assessment & Plan Note (Signed)
Left lumpectomy 08/08/2015: Invasive lobular carcinoma, grade 2, 3.1 cm, broad-based was in the posterior, and inferior margin, LCIS, perineural invasion present, no LV I, ER 80%, PR 0%, HER-2 negative, Ki-67 15%, T2 N1 a stage IIB Left mastectomy 09/21/2015: Residual invasive lobular cancer grade 2, perineural invasion, ALH Mammaprint: Low risk, luminal A, 10% risk of recurrence in 10 years  Recommendation: Adjuvant antiestrogen therapy with anastrozole 1 mg by mouth daily 5-10 years started 10/22/15 Anastrozole Toxicities:.   Return to clinic in 6 months for follow-up and toxicity evaluation on anastrozole.

## 2016-01-30 ENCOUNTER — Encounter: Payer: Self-pay | Admitting: Hematology and Oncology

## 2016-01-30 ENCOUNTER — Ambulatory Visit (HOSPITAL_BASED_OUTPATIENT_CLINIC_OR_DEPARTMENT_OTHER): Payer: Medicare Other | Admitting: Hematology and Oncology

## 2016-01-30 ENCOUNTER — Encounter: Payer: Self-pay | Admitting: Medical Oncology

## 2016-01-30 DIAGNOSIS — C50512 Malignant neoplasm of lower-outer quadrant of left female breast: Secondary | ICD-10-CM | POA: Diagnosis not present

## 2016-01-30 DIAGNOSIS — Z170 Estrogen receptor positive status [ER+]: Secondary | ICD-10-CM | POA: Diagnosis not present

## 2016-01-30 NOTE — Progress Notes (Signed)
Patient Care Team: Lucianne Lei, MD as PCP - General (Family Medicine)  DIAGNOSIS:  Encounter Diagnosis  Name Primary?  . Malignant neoplasm of lower-outer quadrant of left breast of female, estrogen receptor positive ([place])    I have SUMMARY OF ONCOLOGIC HISTORY:   Breast cancer of lower-outer quadrant of left female breast ([place])   06/29/2015 Initial Diagnosis    Left breast biopsy LOQ: invasive lobular carcinoma, grade 1, ER 80%, PR 0%, Ki-67 15%, HER-2 negative ratio 1.24; screening detected distortion without ultrasound correlate       07/10/2015 Breast MRI    Left breast posterior lower outer quadrant: Irregular mass and NME 4.3 x 2.3 x 2.2 cm, no lymph nodes, T2 N0 stage II a clinical stage      08/08/2015 Surgery    Left lumpectomy: Invasive lobular carcinoma, grade 2, 3.1 cm, broad-based was in the posterior, and inferior margin, LCIS, perineural invasion present, no LV I, ER 80%, PR 0%, HER-2 negative, Ki-67 15%, T2 N1 a stage IIB      08/08/2015 Miscellaneous    Mammaprint: Low risk, luminal type a, 10 year risk of recurrence 10%      09/21/2015 Surgery    Left mastectomy: Invasive lobular cancer, grade 2, ALH, PNI present,       10/22/2015 -  Anti-estrogen oral therapy    Anastrozole 1 mg daily       CHIEF COMPLIANT: Follow-up on anastrozole therapy  INTERVAL HISTORY: Kiara Ruiz is a 70 year old with above-mentioned history of left breast cancer treated with lumpectomy and was found to be low risk but Mammaprint testing. She had invasive lobular cancer on mastectomy on 09/21/2015. She is currently on oral anastrozole therapy since 10/22/2015. She is tolerating anastrozole fairly well. She denies any hot flashes or myalgias.  REVIEW OF SYSTEMS:   Constitutional: Denies fevers, chills or abnormal weight loss Eyes: Denies blurriness of vision Ears, nose, mouth, throat, and face: Denies mucositis or sore throat Respiratory: Denies cough, dyspnea or  wheezes Cardiovascular: Denies palpitation, chest discomfort Gastrointestinal:  Denies nausea, heartburn or change in bowel habits Skin: Denies abnormal skin rashes Lymphatics: Denies new lymphadenopathy or easy bruising Neurological:Denies numbness, tingling or new weaknesses Behavioral/Psych: Mood is stable, no new changes  Extremities: No lower extremity edema Breast:  denies any pain or lumps or nodules in either breasts All other systems were reviewed with the patient and are negative.  I have reviewed the past medical history, past surgical history, social history and family history with the patient and they are unchanged from previous note.  ALLERGIES:  has No Known Allergies.  MEDICATIONS:  Current Outpatient Prescriptions  Medication Sig Dispense Refill  . anastrozole (ARIMIDEX) 1 MG tablet Take 1 tablet (1 mg total) by mouth daily. 90 tablet 3  . aspirin 81 MG tablet Take 81 mg by mouth daily.    Marland Kitchen CALCIUM-VITAMIN D PO Take 1 tablet by mouth daily.     Marland Kitchen latanoprost (XALATAN) 0.005 % ophthalmic solution Place 1 drop into both eyes at bedtime.     . Multiple Vitamin (MULTIVITAMIN PO) Take 1 tablet by mouth daily.     Marland Kitchen oxyCODONE-acetaminophen (ROXICET) 5-325 MG tablet Take 1 tablet by mouth every 4 (four) hours as needed. (Patient not taking: Reported on 09/19/2015) 30 tablet 0  . timolol (BETIMOL) 0.5 % ophthalmic solution Place 1 drop into both eyes every morning.      No current facility-administered medications for this visit.     PHYSICAL  EXAMINATION: ECOG PERFORMANCE STATUS: 1 - Symptomatic but completely ambulatory  Vitals:   01/30/16 0945  BP: 117/84  Pulse: 75  Resp: 18  Temp: 98.4 F (36.9 C)   Filed Weights   01/30/16 0945  Weight: 152 lb 11.2 oz (69.3 kg)    GENERAL:alert, no distress and comfortable SKIN: skin color, texture, turgor are normal, no rashes or significant lesions EYES: normal, Conjunctiva are pink and non-injected, sclera  clear OROPHARYNX:no exudate, no erythema and lips, buccal mucosa, and tongue normal  NECK: supple, thyroid normal size, non-tender, without nodularity LYMPH:  no palpable lymphadenopathy in the cervical, axillary or inguinal LUNGS: clear to auscultation and percussion with normal breathing effort HEART: regular rate & rhythm and no murmurs and no lower extremity edema ABDOMEN:abdomen soft, non-tender and normal bowel sounds MUSCULOSKELETAL:no cyanosis of digits and no clubbing  NEURO: alert & oriented x 3 with fluent speech, no focal motor/sensory deficits EXTREMITIES: No lower extremity edema BREAST: No palpable masses or nodules in either right or left breasts. No palpable axillary supraclavicular or infraclavicular adenopathy no breast tenderness or nipple discharge. (exam performed in the presence of a chaperone)  LABORATORY DATA:  I have reviewed the data as listed   Chemistry      Component Value Date/Time   NA 142 07/12/2015 0826   K 4.4 07/12/2015 0826   CO2 28 07/12/2015 0826   BUN 11.2 07/12/2015 0826   CREATININE 0.86 09/21/2015 1845   CREATININE 0.9 07/12/2015 0826      Component Value Date/Time   CALCIUM 9.7 07/12/2015 0826   ALKPHOS 73 07/12/2015 0826   AST 15 07/12/2015 0826   ALT 13 07/12/2015 0826   BILITOT 0.50 07/12/2015 0826       Lab Results  Component Value Date   WBC 9.0 09/22/2015   HGB 11.2 (L) 09/22/2015   HCT 35.7 (L) 09/22/2015   MCV 95.5 09/22/2015   PLT 193 09/22/2015   NEUTROABS 3.7 07/12/2015    ASSESSMENT & PLAN:  Breast cancer of lower-outer quadrant of left female breast ([place]) Left lumpectomy 08/08/2015: Invasive lobular carcinoma, grade 2, 3.1 cm, broad-based was in the posterior, and inferior margin, LCIS, perineural invasion present, no LV I, ER 80%, PR 0%, HER-2 negative, Ki-67 15%, T2 N1 a stage IIB Left mastectomy 09/21/2015: Residual invasive lobular cancer grade 2, perineural invasion, ALH Mammaprint: Low risk, luminal A, 10%  risk of recurrence in 10 years  Recommendation: Adjuvant antiestrogen therapy with anastrozole 1 mg by mouth daily 5-10 years started 10/22/15 Anastrozole Toxicities:. Patient denies any hot flashes or myalgias.  [facility] clinical trial counseling: Patients who have completed definitive therapy for breast cancer are randomized to antiestrogen therapy (5+ years) versus antiestrogen therapy plus Palbociclib (2 years). Palbociclib: If she was randomized to Palbociclib, I discussed the risks and benefits of Ibrance including myelosuppression especially neutropenia and with that risk of infection, there is risk of pulmonary embolism and mild peripheral neuropathy as well. Fatigue, nausea, diarrhea, decreased appetite as well as alopecia and thrombocytopenia are also potential side effects of Palbociclib.  I will see her sooner if she is interested and wants to enroll in the clinical trial. I provided her with literature to take home and read. Return to clinic in 6 months for follow-up and toxicity evaluation on anastrozole.  No orders of the defined types were placed in this encounter.  The patient has a good understanding of the overall plan. she agrees with it. she will call with any problems that  may develop before the next visit here.   Rulon Eisenmenger, MD 01/30/16

## 2016-02-07 ENCOUNTER — Ambulatory Visit (INDEPENDENT_AMBULATORY_CARE_PROVIDER_SITE_OTHER): Payer: Medicare Other | Admitting: Gynecology

## 2016-02-07 ENCOUNTER — Encounter: Payer: Self-pay | Admitting: Gynecology

## 2016-02-07 VITALS — BP 124/76 | Ht 62.0 in | Wt 154.0 lb

## 2016-02-07 DIAGNOSIS — N952 Postmenopausal atrophic vaginitis: Secondary | ICD-10-CM | POA: Diagnosis not present

## 2016-02-07 DIAGNOSIS — M8580 Other specified disorders of bone density and structure, unspecified site: Secondary | ICD-10-CM

## 2016-02-07 DIAGNOSIS — Z01411 Encounter for gynecological examination (general) (routine) with abnormal findings: Secondary | ICD-10-CM

## 2016-02-07 DIAGNOSIS — C50912 Malignant neoplasm of unspecified site of left female breast: Secondary | ICD-10-CM

## 2016-02-07 NOTE — Patient Instructions (Signed)

## 2016-02-07 NOTE — Progress Notes (Signed)
    Kiara KRUPNICK 1945/03/22 [id number]        70 y.o.  G1P1001  for annual exam.    Past medical history,surgical history, problem list, medications, allergies, family history and social history were all reviewed and documented as reviewed in the EPIC chart.  ROS:  Performed with pertinent positives and negatives included in the history, assessment and plan.   Additional significant findings :  None   Exam: Kiara Ruiz assistant Vitals:   02/07/16 1418  BP: 124/76  Weight: 154 lb (69.9 kg)  Height: 5\' 2"  (1.575 m)   Body mass index is 28.17 kg/m.  General appearance:  Normal affect, orientation and appearance. Skin: Grossly normal HEENT: Without gross lesions.  No cervical or supraclavicular adenopathy. Thyroid normal.  Lungs:  Clear without wheezing, rales or rhonchi Cardiac: RR, without RMG Abdominal:  Soft, nontender, without masses, guarding, rebound, organomegaly or hernia Breasts:  Examined lying and sitting. Right without masses, retractions, discharge or axillary adenopathy. Left status post mastectomy. Still healing suture lines. Chest wall without evidence of masses or axillary adenopathy Pelvic:  Ext, BUS, Vagina with atrophic changes  Cervix with atrophic changes/with upper vagina  Uterus difficult to palpate but no gross masses or tenderness  Adnexa without masses or tenderness    Anus and perineum normal   Rectovaginal normal sphincter tone without palpated masses or tenderness.    Assessment/Plan:  70 y.o. G39P1001 female for annual exam.   1. Postmenopausal/atrophic genital changes. No significant hot flushes, night sweats, vaginal dryness or any vaginal bleeding. Continue monitor report any issues or bleeding. 2. Recent diagnosis of left breast cancer status post mastectomy. On Arimidex now. Exam NED. Continue follow up with her surgeon and oncologist. 3. Osteopenia. DEXA 02/2015 T score -2 FRAX 10%/1.8%. Overall stable from prior DEXA. Plan repeat next  year noting patient is just starting on Arimidex now. 4. Colonoscopy due this year and she knows to call and arrange and agrees to do so. 5. Pap smear 2016. No Pap smear done today. History of ASCUS with negative high-risk HPV 2015. 6. Health maintenance. No routine lab work done as patient does this elsewhere.   Anastasio Auerbach MD, 2:45 PM 02/07/2016

## 2016-04-01 ENCOUNTER — Telehealth: Payer: Self-pay | Admitting: Oncology

## 2016-04-01 NOTE — Telephone Encounter (Signed)
Lab appointment was added for today. Appointments scheduled per 04/01/16 los. Patient was given a copy of the appointment schedule and AVS report per 04/01/16 los.

## 2016-04-18 ENCOUNTER — Other Ambulatory Visit: Payer: Self-pay | Admitting: Emergency Medicine

## 2016-04-18 MED ORDER — ANASTROZOLE 1 MG PO TABS: 1.0000 mg | ORAL_TABLET | Freq: Every day | ORAL | 3 refills | Status: DC

## 2016-07-29 NOTE — Assessment & Plan Note (Signed)
Left lumpectomy 08/08/2015: Invasive lobular carcinoma, grade 2, 3.1 cm, broad-based was in the posterior, and inferior margin, LCIS, perineural invasion present, no LV I, ER 80%, PR 0%, HER-2 negative, Ki-67 15%, T2 N1 a stage IIB Left mastectomy 09/21/2015: Residual invasive lobular cancer grade 2, perineural invasion, ALH Mammaprint: Low risk, luminal A, 10% risk of recurrence in 10 years  Current Treatment: Adjuvant antiestrogen therapy with anastrozole 1 mg by mouth daily 5-10 years started 10/22/15 Anastrozole Toxicities:. Patient denies any hot flashes or myalgias.  Surveillance: 1. Breast exam: Benign 2. Mammograms: Need to be done

## 2016-07-30 ENCOUNTER — Ambulatory Visit (HOSPITAL_BASED_OUTPATIENT_CLINIC_OR_DEPARTMENT_OTHER): Payer: Medicare Other | Admitting: Hematology and Oncology

## 2016-07-30 DIAGNOSIS — C50512 Malignant neoplasm of lower-outer quadrant of left female breast: Secondary | ICD-10-CM | POA: Diagnosis not present

## 2016-07-30 DIAGNOSIS — Z170 Estrogen receptor positive status [ER+]: Secondary | ICD-10-CM | POA: Diagnosis not present

## 2016-07-30 MED ORDER — ANASTROZOLE 1 MG PO TABS: 1.0000 mg | ORAL_TABLET | Freq: Every day | ORAL | 3 refills | Status: DC

## 2016-07-30 NOTE — Progress Notes (Signed)
Patient Care Team: Lucianne Lei, MD as PCP - General (Family Medicine)  DIAGNOSIS:  Encounter Diagnosis  Name Primary?  . Malignant neoplasm of lower-outer quadrant of left breast of female, estrogen receptor positive ([place])     SUMMARY OF ONCOLOGIC HISTORY:   Breast cancer of lower-outer quadrant of left female breast ([place])   06/29/2015 Initial Diagnosis    Left breast biopsy LOQ: invasive lobular carcinoma, grade 1, ER 80%, PR 0%, Ki-67 15%, HER-2 negative ratio 1.24; screening detected distortion without ultrasound correlate       07/10/2015 Breast MRI    Left breast posterior lower outer quadrant: Irregular mass and NME 4.3 x 2.3 x 2.2 cm, no lymph nodes, T2 N0 stage II a clinical stage      08/08/2015 Surgery    Left lumpectomy ([place]): Invasive lobular carcinoma, grade 2, 3.1 cm, broad-based was in the posterior, and inferior margin, LCIS, perineural invasion present, no LV I, ER 80%, PR 0%, HER-2 negative, Ki-67 15%, T2 N1 a stage IIB      08/08/2015 Miscellaneous    Mammaprint: Low risk, luminal type a, 10 year risk of recurrence 10%      09/21/2015 Surgery    Left mastectomy: Invasive lobular cancer, grade 2, ALH, PNI present,       10/22/2015 -  Anti-estrogen oral therapy    Anastrozole 1 mg daily       CHIEF COMPLIANT: Follow-up on anastrozole therapy  INTERVAL HISTORY: Kiara Ruiz is a 71 year old with above-mentioned history left lumpectomy which later led to mastectomy. She was low risk on Mammaprint testing and hence did not need chemotherapy. She is currently on anastrozole therapy and appears to be tolerating it extremely well. She denies any hot flashes or myalgias. She denies any lumps or nodules in the breasts.   REVIEW OF SYSTEMS:   Constitutional: Denies fevers, chills or abnormal weight loss Eyes: Denies blurriness of vision Ears, nose, mouth, throat, and face: Denies mucositis or sore throat Respiratory: Denies cough, dyspnea or  wheezes Cardiovascular: Denies palpitation, chest discomfort Gastrointestinal:  Denies nausea, heartburn or change in bowel habits Skin: Denies abnormal skin rashes Lymphatics: Denies new lymphadenopathy or easy bruising Neurological:Denies numbness, tingling or new weaknesses Behavioral/Psych: Mood is stable, no new changes  Extremities: No lower extremity edema Breast:  denies any pain or lumps or nodules in either breasts All other systems were reviewed with the patient and are negative.  I have reviewed the past medical history, past surgical history, social history and family history with the patient and they are unchanged from previous note.  ALLERGIES:  has No Known Allergies.  MEDICATIONS:  Current Outpatient Prescriptions  Medication Sig Dispense Refill  . anastrozole (ARIMIDEX) 1 MG tablet Take 1 tablet (1 mg total) by mouth daily. 90 tablet 3  . aspirin 81 MG tablet Take 81 mg by mouth daily.    Marland Kitchen CALCIUM-VITAMIN D PO Take 1 tablet by mouth daily.     Marland Kitchen latanoprost (XALATAN) 0.005 % ophthalmic solution Place 1 drop into both eyes at bedtime.     . Multiple Vitamin (MULTIVITAMIN PO) Take 1 tablet by mouth daily.     . timolol (BETIMOL) 0.5 % ophthalmic solution Place 1 drop into both eyes every morning.      No current facility-administered medications for this visit.     PHYSICAL EXAMINATION: ECOG PERFORMANCE STATUS: 1 - Symptomatic but completely ambulatory  Vitals:   07/30/16 0946  BP: 126/81  Pulse: 87  Resp:  18  Temp: 97.8 F (36.6 C)   Filed Weights   07/30/16 0946  Weight: 155 lb 8 oz (70.5 kg)    GENERAL:alert, no distress and comfortable SKIN: skin color, texture, turgor are normal, no rashes or significant lesions EYES: normal, Conjunctiva are pink and non-injected, sclera clear OROPHARYNX:no exudate, no erythema and lips, buccal mucosa, and tongue normal  NECK: supple, thyroid normal size, non-tender, without nodularity LYMPH:  no palpable  lymphadenopathy in the cervical, axillary or inguinal LUNGS: clear to auscultation and percussion with normal breathing effort HEART: regular rate & rhythm and no murmurs and no lower extremity edema ABDOMEN:abdomen soft, non-tender and normal bowel sounds MUSCULOSKELETAL:no cyanosis of digits and no clubbing  NEURO: alert & oriented x 3 with fluent speech, no focal motor/sensory deficits EXTREMITIES: No lower extremity edema BREAST: No palpable masses or nodules in either right or left breasts. No palpable axillary supraclavicular or infraclavicular adenopathy no breast tenderness or nipple discharge. (exam performed in the presence of a chaperone)  LABORATORY DATA:  I have reviewed the data as listed   Chemistry      Component Value Date/Time   NA 142 07/12/2015 0826   K 4.4 07/12/2015 0826   CO2 28 07/12/2015 0826   BUN 11.2 07/12/2015 0826   CREATININE 0.86 09/21/2015 1845   CREATININE 0.9 07/12/2015 0826      Component Value Date/Time   CALCIUM 9.7 07/12/2015 0826   ALKPHOS 73 07/12/2015 0826   AST 15 07/12/2015 0826   ALT 13 07/12/2015 0826   BILITOT 0.50 07/12/2015 0826       Lab Results  Component Value Date   WBC 9.0 09/22/2015   HGB 11.2 (L) 09/22/2015   HCT 35.7 (L) 09/22/2015   MCV 95.5 09/22/2015   PLT 193 09/22/2015   NEUTROABS 3.7 07/12/2015    ASSESSMENT & PLAN:  Breast cancer of lower-outer quadrant of left female breast ([place]) Left lumpectomy 08/08/2015: Invasive lobular carcinoma, grade 2, 3.1 cm, broad-based was in the posterior, and inferior margin, LCIS, perineural invasion present, no LV I, ER 80%, PR 0%, HER-2 negative, Ki-67 15%, T2 N1 a stage IIB Left mastectomy 09/21/2015: Residual invasive lobular cancer grade 2, perineural invasion, ALH Mammaprint: Low risk, luminal A, 10% risk of recurrence in 10 years  Current Treatment: Adjuvant antiestrogen therapy with anastrozole 1 mg by mouth daily 5-10 years started 10/22/15 Anastrozole  Toxicities:. Patient denies any hot flashes or myalgias.  Surveillance: 1. Breast exam: 07/30/2016 Benign 2. Mammograms: Need to be done  Return to clinic in 1 year for follow-up I spent 25 minutes talking to the patient of which more than half was spent in counseling and coordination of care.  Orders Placed This Encounter  Procedures  . MM DIAG BREAST TOMO UNI RIGHT    Standing Status:   Future    Standing Expiration Date:   09/29/2017    Order Specific Question:   Reason for Exam (SYMPTOM  OR DIAGNOSIS REQUIRED)    Answer:   Annual mammogram for breast cancer history    Order Specific Question:   Preferred imaging location?    Answer:   [facility]   The patient has a good understanding of the overall plan. she agrees with it. she will call with any problems that may develop before the next visit here.   Rulon Eisenmenger, MD 07/30/16

## 2016-09-25 ENCOUNTER — Ambulatory Visit
Admission: RE | Admit: 2016-09-25 | Discharge: 2016-09-25 | Disposition: A | Discharge status: Home / Self Care | Payer: Medicare Other | Source: Ambulatory Visit | Attending: Hematology and Oncology | Admitting: Hematology and Oncology

## 2016-09-25 DIAGNOSIS — Z170 Estrogen receptor positive status [ER+]: Principal | ICD-10-CM

## 2016-09-25 DIAGNOSIS — C50512 Malignant neoplasm of lower-outer quadrant of left female breast: Secondary | ICD-10-CM

## 2017-02-07 ENCOUNTER — Encounter: Payer: Self-pay | Admitting: Gynecology

## 2017-02-07 ENCOUNTER — Ambulatory Visit: Payer: Medicare Other | Admitting: Gynecology

## 2017-02-07 VITALS — BP 122/78 | Ht 62.0 in | Wt 157.0 lb

## 2017-02-07 DIAGNOSIS — M8580 Other specified disorders of bone density and structure, unspecified site: Secondary | ICD-10-CM | POA: Diagnosis not present

## 2017-02-07 DIAGNOSIS — N952 Postmenopausal atrophic vaginitis: Secondary | ICD-10-CM

## 2017-02-07 DIAGNOSIS — Z01411 Encounter for gynecological examination (general) (routine) with abnormal findings: Secondary | ICD-10-CM

## 2017-02-07 DIAGNOSIS — Z853 Personal history of malignant neoplasm of breast: Secondary | ICD-10-CM | POA: Diagnosis not present

## 2017-02-07 NOTE — Progress Notes (Signed)
    Flornce Record Dech 08-08-1945 [id number]        71 y.o.  G1P1001 for annual gynecologic exam.  Doing well without gynecologic complaints.  Past medical history,surgical history, problem list, medications, allergies, family history and social history were all reviewed and documented as reviewed in the EPIC chart.  ROS:  Performed with pertinent positives and negatives included in the history, assessment and plan.   Additional significant findings : None   Exam: Caryn Bee assistant Vitals:   02/07/17 0953  BP: 122/78  Weight: 157 lb (71.2 kg)  Height: 5\' 2"  (1.575 m)   Body mass index is 28.72 kg/m.  General appearance:  Normal affect, orientation and appearance. Skin: Grossly normal HEENT: Without gross lesions.  No cervical or supraclavicular adenopathy. Thyroid normal.  Lungs:  Clear without wheezing, rales or rhonchi Cardiac: RR, without RMG Abdominal:  Soft, nontender, without masses, guarding, rebound, organomegaly or hernia Breasts:  Examined lying and sitting.  Right without masses, retractions, discharge or axillary adenopathy.  Left status post mastectomy.  No masses or axillary adenopathy Pelvic:  Ext, BUS, Vagina: With atrophic changes  Cervix: With atrophic changes flush with upper vagina  Uterus: Difficult to palpate but no gross masses or tenderness   Adnexa: Without gross masses or tenderness    Anus and perineum: Normal   Rectovaginal: Normal sphincter tone without palpated masses or tenderness.    Assessment/Plan:  71 y.o. G64P1001 female for annual gynecologic exam.   1. Postmenopausal/atrophic genital changes.  No significant hot flushes, night sweats, vaginal dryness or any bleeding.  Continue to monitor and report any issues or bleeding. 2. History of left breast cancer.  Exam NED.  On Arimidex.  Continue to follow-up with her oncologist's.  Last mammography 09/2016. 3. Pap smear 02/2015.  No Pap smear done today.  History of ASCUS with negative high  risk HPV 2015.  Plan repeat Pap smear at 3-year interval. 4. Osteopenia.  DEXA 2016 T score -2.  FRAX 10% / 1.8%.  Repeat DEXA now a 2-year interval.  Patient is on Arimidex which may accelerate bone loss.  She will schedule in follow-up for this. 5. Colonoscopy 2018.  Repeat at their recommended interval. 6. Health maintenance.  No routine lab work done as patient does this elsewhere.  Follow-up for bone density otherwise 1 year, sooner as needed.   Anastasio Auerbach MD, 10:13 AM 02/07/2017

## 2017-02-07 NOTE — Patient Instructions (Signed)
Followup for bone density as scheduled. 

## 2017-03-13 ENCOUNTER — Other Ambulatory Visit: Payer: Self-pay | Admitting: Gynecology

## 2017-03-13 ENCOUNTER — Ambulatory Visit (INDEPENDENT_AMBULATORY_CARE_PROVIDER_SITE_OTHER): Payer: Medicare Other

## 2017-03-13 DIAGNOSIS — M8589 Other specified disorders of bone density and structure, multiple sites: Secondary | ICD-10-CM | POA: Diagnosis not present

## 2017-03-13 DIAGNOSIS — Z780 Asymptomatic menopausal state: Secondary | ICD-10-CM | POA: Diagnosis not present

## 2017-03-13 DIAGNOSIS — M8580 Other specified disorders of bone density and structure, unspecified site: Secondary | ICD-10-CM

## 2017-03-14 ENCOUNTER — Telehealth: Payer: Self-pay | Admitting: Gynecology

## 2017-03-14 ENCOUNTER — Encounter: Payer: Self-pay | Admitting: *Deleted

## 2017-03-14 ENCOUNTER — Encounter: Payer: Self-pay | Admitting: Gynecology

## 2017-03-14 NOTE — Telephone Encounter (Signed)
Tell patient that her most recent bone density shows study.  Calculated fracture risk is not increased to indicate the need for medication at this time.  It is important though that she participate in weightbearing exercise such as walking on a regular basis and has her vitamin D level checked to make sure she is in the therapeutic range.  I would recommend repeating the bone density in 2 years.

## 2017-03-14 NOTE — Telephone Encounter (Signed)
Sent mychart message

## 2017-03-27 NOTE — Telephone Encounter (Signed)
Patient informed, will have vitamin d level drawn at PCP and send results.

## 2017-05-21 ENCOUNTER — Other Ambulatory Visit: Payer: Self-pay | Admitting: Hematology and Oncology

## 2017-05-21 DIAGNOSIS — Z1231 Encounter for screening mammogram for malignant neoplasm of breast: Secondary | ICD-10-CM

## 2017-07-30 ENCOUNTER — Telehealth: Payer: Self-pay | Admitting: Hematology and Oncology

## 2017-07-30 ENCOUNTER — Inpatient Hospital Stay: Payer: Medicare Other | Attending: Hematology and Oncology | Admitting: Hematology and Oncology

## 2017-07-30 DIAGNOSIS — C50512 Malignant neoplasm of lower-outer quadrant of left female breast: Secondary | ICD-10-CM

## 2017-07-30 DIAGNOSIS — Z7982 Long term (current) use of aspirin: Secondary | ICD-10-CM | POA: Diagnosis not present

## 2017-07-30 DIAGNOSIS — Z9012 Acquired absence of left breast and nipple: Secondary | ICD-10-CM | POA: Insufficient documentation

## 2017-07-30 DIAGNOSIS — Z79899 Other long term (current) drug therapy: Secondary | ICD-10-CM | POA: Insufficient documentation

## 2017-07-30 DIAGNOSIS — Z170 Estrogen receptor positive status [ER+]: Secondary | ICD-10-CM | POA: Diagnosis not present

## 2017-07-30 DIAGNOSIS — Z79811 Long term (current) use of aromatase inhibitors: Secondary | ICD-10-CM | POA: Diagnosis not present

## 2017-07-30 MED ORDER — ANASTROZOLE 1 MG PO TABS: 1.0000 mg | ORAL_TABLET | Freq: Every day | ORAL | 3 refills | Status: DC

## 2017-07-30 NOTE — Telephone Encounter (Signed)
Gave patient AVs and calendar of upcoming June 2020 appointments.

## 2017-07-30 NOTE — Assessment & Plan Note (Signed)
Left lumpectomy 08/08/2015: Invasive lobular carcinoma, grade 2, 3.1 cm, broad-based was in the posterior, and inferior margin, LCIS, perineural invasion present, no LV I, ER 80%, PR 0%, HER-2 negative, Ki-67 15%, T2 N1 a stage IIB Left mastectomy 09/21/2015: Residual invasive lobular cancer grade 2, perineural invasion, ALH Mammaprint: Low risk, luminal A, 10% risk of recurrence in 10 years  Current Treatment: Adjuvant antiestrogen therapy with anastrozole 1 mg by mouth daily 5-10 years started 10/22/15 Anastrozole Toxicities:. Patient denies any hot flashes or myalgias.  Surveillance: 1. Breast exam: 07/30/2096 Benign 2. Mammograms: Scheduled for 09/29/2017  Return to clinic in 1 year for follow-up

## 2017-07-30 NOTE — Progress Notes (Signed)
Patient Care Team: Lucianne Lei, MD as PCP - General (Family Medicine)  DIAGNOSIS:  Encounter Diagnosis  Name Primary?  . Malignant neoplasm of lower-outer quadrant of left breast of female, estrogen receptor positive ([place])     SUMMARY OF ONCOLOGIC HISTORY:   Breast cancer of lower-outer quadrant of left female breast ([place])   06/29/2015 Initial Diagnosis    Left breast biopsy LOQ: invasive lobular carcinoma, grade 1, ER 80%, PR 0%, Ki-67 15%, HER-2 negative ratio 1.24; screening detected distortion without ultrasound correlate       07/10/2015 Breast MRI    Left breast posterior lower outer quadrant: Irregular mass and NME 4.3 x 2.3 x 2.2 cm, no lymph nodes, T2 N0 stage II a clinical stage      08/08/2015 Surgery    Left lumpectomy ([facility]): Invasive lobular carcinoma, grade 2, 3.1 cm, broad-based was in the posterior, and inferior margin, LCIS, perineural invasion present, no LV I, ER 80%, PR 0%, HER-2 negative, Ki-67 15%, T2 N1 a stage IIB      08/08/2015 Miscellaneous    Mammaprint: Low risk, luminal type a, 10 year risk of recurrence 10%      09/21/2015 Surgery    Left mastectomy: Invasive lobular cancer, grade 2, ALH, PNI present,       10/22/2015 -  Anti-estrogen oral therapy    Anastrozole 1 mg daily       CHIEF COMPLIANT: Follow-up on anastrozole therapy  INTERVAL HISTORY: Kiara Ruiz is a 72 year old with above-mentioned history of left breast cancer treated with initial lumpectomy followed by mastectomy.  She is currently on oral antiestrogen therapy with anastrozole.  She is tolerating anastrozole extremely well.  She denies any pain or discomfort lumps or nodules in the breast  REVIEW OF SYSTEMS:   Constitutional: Denies fevers, chills or abnormal weight loss Eyes: Denies blurriness of vision Ears, nose, mouth, throat, and face: Denies mucositis or sore throat Respiratory: Denies cough, dyspnea or wheezes Cardiovascular: Denies palpitation, chest  discomfort Gastrointestinal:  Denies nausea, heartburn or change in bowel habits Skin: Denies abnormal skin rashes Lymphatics: Denies new lymphadenopathy or easy bruising Neurological:Denies numbness, tingling or new weaknesses Behavioral/Psych: Mood is stable, no new changes  Extremities: No lower extremity edema Breast:  denies any pain or lumps or nodules in either breasts All other systems were reviewed with the patient and are negative.  I have reviewed the past medical history, past surgical history, social history and family history with the patient and they are unchanged from previous note.  ALLERGIES:  has No Known Allergies.  MEDICATIONS:  Current Outpatient Medications  Medication Sig Dispense Refill  . anastrozole (ARIMIDEX) 1 MG tablet Take 1 tablet (1 mg total) by mouth daily. 90 tablet 3  . aspirin 81 MG tablet Take 81 mg by mouth daily.    Marland Kitchen CALCIUM-VITAMIN D PO Take 1 tablet by mouth daily.     Marland Kitchen latanoprost (XALATAN) 0.005 % ophthalmic solution Place 1 drop into both eyes at bedtime.     . Multiple Vitamin (MULTIVITAMIN PO) Take 1 tablet by mouth daily.     . timolol (BETIMOL) 0.5 % ophthalmic solution Place 1 drop into both eyes every morning.      No current facility-administered medications for this visit.     PHYSICAL EXAMINATION: ECOG PERFORMANCE STATUS: 1 - Symptomatic but completely ambulatory  Vitals:   07/30/17 1329  BP: 112/65  Pulse: 83  Resp: 17  Temp: 98.5 F (36.9 C)  SpO2: 100%  Filed Weights   07/30/17 1329  Weight: 155 lb 4.8 oz (70.4 kg)    GENERAL:alert, no distress and comfortable SKIN: skin color, texture, turgor are normal, no rashes or significant lesions EYES: normal, Conjunctiva are pink and non-injected, sclera clear OROPHARYNX:no exudate, no erythema and lips, buccal mucosa, and tongue normal  NECK: supple, thyroid normal size, non-tender, without nodularity LYMPH:  no palpable lymphadenopathy in the cervical, axillary or  inguinal LUNGS: clear to auscultation and percussion with normal breathing effort HEART: regular rate & rhythm and no murmurs and no lower extremity edema ABDOMEN:abdomen soft, non-tender and normal bowel sounds MUSCULOSKELETAL:no cyanosis of digits and no clubbing  NEURO: alert & oriented x 3 with fluent speech, no focal motor/sensory deficits EXTREMITIES: No lower extremity edema BREAST: No palpable masses or nodules in either right or left breasts. No palpable axillary supraclavicular or infraclavicular adenopathy no breast tenderness or nipple discharge. (exam performed in the presence of a chaperone)  LABORATORY DATA:  I have reviewed the data as listed CMP Latest Ref Rng & Units 09/21/2015 07/12/2015  Glucose 70 - 140 mg/dl - 82  BUN 7.0 - 26.0 mg/dL - 11.2  Creatinine 0.44 - 1.00 mg/dL 0.86 0.9  Sodium 136 - 145 mEq/L - 142  Potassium 3.5 - 5.1 mEq/L - 4.4  CO2 22 - 29 mEq/L - 28  Calcium 8.4 - 10.4 mg/dL - 9.7  Total Protein 6.4 - 8.3 g/dL - 7.5  Total Bilirubin 0.20 - 1.20 mg/dL - 0.50  Alkaline Phos 40 - 150 U/L - 73  AST 5 - 34 U/L - 15  ALT 0 - 55 U/L - 13    Lab Results  Component Value Date   WBC 9.0 09/22/2015   HGB 11.2 (L) 09/22/2015   HCT 35.7 (L) 09/22/2015   MCV 95.5 09/22/2015   PLT 193 09/22/2015   NEUTROABS 3.7 07/12/2015    ASSESSMENT & PLAN:  Breast cancer of lower-outer quadrant of left female breast ([place]) Left lumpectomy 08/08/2015: Invasive lobular carcinoma, grade 2, 3.1 cm, broad-based was in the posterior, and inferior margin, LCIS, perineural invasion present, no LV I, ER 80%, PR 0%, HER-2 negative, Ki-67 15%, T2 N1 a stage IIB Left mastectomy 09/21/2015: Residual invasive lobular cancer grade 2, perineural invasion, ALH Mammaprint: Low risk, luminal A, 10% risk of recurrence in 10 years  Current Treatment: Adjuvant antiestrogen therapy with anastrozole 1 mg by mouth daily 5-10 years started 10/22/15 Anastrozole Toxicities:. Patient denies  any hot flashes or myalgias.  Surveillance: 1. Breast exam: 07/30/2096 Benign 2. Mammograms: Scheduled for 09/29/2017  I renewed her prescription for anastrozole Return to clinic in 1 year for follow-up    No orders of the defined types were placed in this encounter.  The patient has a good understanding of the overall plan. she agrees with it. she will call with any problems that may develop before the next visit here.   Harriette Ohara, MD 07/30/17

## 2017-09-29 ENCOUNTER — Ambulatory Visit
Admission: RE | Admit: 2017-09-29 | Discharge: 2017-09-29 | Disposition: A | Discharge status: Home / Self Care | Payer: Medicare Other | Source: Ambulatory Visit | Attending: Hematology and Oncology | Admitting: Hematology and Oncology

## 2017-09-29 DIAGNOSIS — Z1231 Encounter for screening mammogram for malignant neoplasm of breast: Secondary | ICD-10-CM

## 2017-11-08 DIAGNOSIS — N3020 Other chronic cystitis without hematuria: Secondary | ICD-10-CM | POA: Insufficient documentation

## 2018-02-11 ENCOUNTER — Encounter: Payer: Medicare Other | Admitting: Gynecology

## 2018-03-17 ENCOUNTER — Encounter: Payer: Medicare Other | Admitting: Gynecology

## 2018-03-25 ENCOUNTER — Ambulatory Visit (INDEPENDENT_AMBULATORY_CARE_PROVIDER_SITE_OTHER): Payer: Medicare Other | Admitting: Gynecology

## 2018-03-25 ENCOUNTER — Encounter: Payer: Self-pay | Admitting: Gynecology

## 2018-03-25 VITALS — BP 124/76 | Ht 62.0 in | Wt 156.0 lb

## 2018-03-25 DIAGNOSIS — Z01419 Encounter for gynecological examination (general) (routine) without abnormal findings: Secondary | ICD-10-CM

## 2018-03-25 DIAGNOSIS — M8580 Other specified disorders of bone density and structure, unspecified site: Secondary | ICD-10-CM

## 2018-03-25 DIAGNOSIS — N952 Postmenopausal atrophic vaginitis: Secondary | ICD-10-CM

## 2018-03-25 DIAGNOSIS — Z853 Personal history of malignant neoplasm of breast: Secondary | ICD-10-CM

## 2018-03-25 NOTE — Patient Instructions (Signed)
Check at home to make sure when your last colonoscopy was done.  Follow-up in 1 year for annual exam, sooner as needed.

## 2018-03-25 NOTE — Addendum Note (Signed)
Addended by: Nelva Nay on: 03/25/2018 12:53 PM   Modules accepted: Orders

## 2018-03-25 NOTE — Progress Notes (Signed)
    Kiara Ruiz 10-Aug-1945 [id number]        73 y.o.  G1P1001 for annual gynecologic exam.  Without gynecologic complaints  Past medical history,surgical history, problem list, medications, allergies, family history and social history were all reviewed and documented as reviewed in the EPIC chart.  ROS:  Performed with pertinent positives and negatives included in the history, assessment and plan.   Additional significant findings : None   Exam: Caryn Bee assistant Vitals:   03/25/18 1218  BP: 124/76  Weight: 156 lb (70.8 kg)  Height: 5\' 2"  (1.575 m)   Body mass index is 28.53 kg/m.  General appearance:  Normal affect, orientation and appearance. Skin: Grossly normal HEENT: Without gross lesions.  No cervical or supraclavicular adenopathy. Thyroid normal.  Lungs:  Clear without wheezing, rales or rhonchi Cardiac: RR, without RMG Abdominal:  Soft, nontender, without masses, guarding, rebound, organomegaly or hernia Breasts:  Examined lying and sitting.  Right without masses, retractions, discharge or axillary adenopathy.  Left status post mastectomy.  No masses or axillary adenopathy. Pelvic:  Ext, BUS, Vagina: With atrophic changes  Cervix: With atrophic changes, flush with upper vagina.  Pap smear done  Uterus: Difficult to palpate but no gross masses or tenderness  Adnexa: Without masses or tenderness    Anus and perineum: Normal   Rectovaginal: Normal sphincter tone without palpated masses or tenderness.    Assessment/Plan:  73 y.o. G20P1001 female for annual gynecologic exam.  1. Postmenopausal.  No significant menopausal symptoms or any vaginal bleeding. 2. History of left breast cancer status post mastectomy.  Exam NED.  Mammography 09/2017.  Continue to follow-up with oncology as scheduled. 3. Colonoscopy 2018.  Told to repeat at 10-year interval.  Patient is going to check paperwork at home just to verify she did have a colonoscopy in 2018. 4. Osteopenia.  DEXA  2019 T score -1.7 FRAX 4.9% / 0.9%.  Plan repeat DEXA at 2-year interval. 5. Pap smear 2016.  Pap smear done today.  History of ASCUS negative high risk HPV 2015. 6. Health maintenance.  No routine lab work done as patient does this elsewhere.  Follow-up 1 year, sooner as needed   Anastasio Auerbach MD, 12:41 PM 03/25/2018

## 2018-03-26 LAB — PAP IG W/ RFLX HPV ASCU

## 2018-07-30 NOTE — Assessment & Plan Note (Signed)
Left lumpectomy 08/08/2015: Invasive lobular carcinoma, grade 2, 3.1 cm, broad-based was in the posterior, and inferior margin, LCIS, perineural invasion present, no LV I, ER 80%, PR 0%, HER-2 negative, Ki-67 15%, T2 N1 a stage IIB Left mastectomy 09/21/2015: Residual invasive lobular cancer grade 2, perineural invasion, ALH Mammaprint: Low risk, luminal A, 10% risk of recurrence in 10 years  Current Treatment: Adjuvant antiestrogen therapy with anastrozole 1 mg by mouth daily 5-10 years started 10/22/15 Anastrozole Toxicities:. Patient denies any hot flashes or myalgias.  Surveillance: Mammograms:  09/30/2017: Benign, breast density category D  I renewed her prescription for anastrozole Return to clinic in 1 year for follow-up

## 2018-08-05 ENCOUNTER — Telehealth: Payer: Self-pay | Admitting: Hematology and Oncology

## 2018-08-05 NOTE — Telephone Encounter (Signed)
Contacted pt to verify webex visit for pre reg °

## 2018-08-05 NOTE — Progress Notes (Signed)
HEMATOLOGY-ONCOLOGY DOXIMITY VISIT PROGRESS NOTE  I connected with Aanchal Cope Poole on 08/06/2018 at  1:45 PM EDT by [place] audio conference and verified that I am speaking with the correct person using two identifiers.  I discussed the limitations, risks, security and privacy concerns of performing an evaluation and management service by Doximity and the availability of in person appointments.  I also discussed with the patient that there may be a patient responsible charge related to this service. The patient expressed understanding and agreed to proceed.  Patient's Location: Home Physician Location: Clinic  CHIEF COMPLIANT: Follow-up on anastrozole therapy  INTERVAL HISTORY: Kiara Ruiz is a 73 y.o. female with above-mentioned history of left breast cancer treated with initial lumpectomy followed by mastectomy. She is currently on oral antiestrogen therapy with anastrozole. I last saw her a year ago. Her most recent mammogram on 09/30/18 showed no evidence of malignancy. She presents today over Doximity for annual follow-up.   We tried to do the video conference but after several attempts we discontinued and we performed a audio visit.    Breast cancer of lower-outer quadrant of left female breast ([place])   06/29/2015 Initial Diagnosis    Left breast biopsy LOQ: invasive lobular carcinoma, grade 1, ER 80%, PR 0%, Ki-67 15%, HER-2 negative ratio 1.24; screening detected distortion without ultrasound correlate     07/10/2015 Breast MRI    Left breast posterior lower outer quadrant: Irregular mass and NME 4.3 x 2.3 x 2.2 cm, no lymph nodes, T2 N0 stage II a clinical stage    08/08/2015 Surgery    Left lumpectomy ([facility]): Invasive lobular carcinoma, grade 2, 3.1 cm, broad-based was in the posterior, and inferior margin, LCIS, perineural invasion present, no LV I, ER 80%, PR 0%, HER-2 negative, Ki-67 15%, T2 N1 a stage IIB    08/08/2015 Miscellaneous    Mammaprint: Low risk, luminal type a, 10  year risk of recurrence 10%    09/21/2015 Surgery    Left mastectomy: Invasive lobular cancer, grade 2, ALH, PNI present,     10/22/2015 -  Anti-estrogen oral therapy    Anastrozole 1 mg daily     REVIEW OF SYSTEMS:   Constitutional: Denies fevers, chills or abnormal weight loss Eyes: Denies blurriness of vision Ears, nose, mouth, throat, and face: Denies mucositis or sore throat Respiratory: Denies cough, dyspnea or wheezes Cardiovascular: Denies palpitation, chest discomfort Gastrointestinal:  Denies nausea, heartburn or change in bowel habits Skin: Denies abnormal skin rashes Lymphatics: Denies new lymphadenopathy or easy bruising Neurological:Denies numbness, tingling or new weaknesses Behavioral/Psych: Mood is stable, no new changes  Extremities: No lower extremity edema Breast: denies any pain or lumps or nodules in either breasts All other systems were reviewed with the patient and are negative.  Observations/Objective:  There were no vitals filed for this visit. There is no height or weight on file to calculate BMI.  I have reviewed the data as listed CMP Latest Ref Rng & Units 09/21/2015 07/12/2015  Glucose 70 - 140 mg/dl - 82  BUN 7.0 - 26.0 mg/dL - 11.2  Creatinine 0.44 - 1.00 mg/dL 0.86 0.9  Sodium 136 - 145 mEq/L - 142  Potassium 3.5 - 5.1 mEq/L - 4.4  CO2 22 - 29 mEq/L - 28  Calcium 8.4 - 10.4 mg/dL - 9.7  Total Protein 6.4 - 8.3 g/dL - 7.5  Total Bilirubin 0.20 - 1.20 mg/dL - 0.50  Alkaline Phos 40 - 150 U/L - 73  AST 5 -  34 U/L - 15  ALT 0 - 55 U/L - 13    Lab Results  Component Value Date   WBC 9.0 09/22/2015   HGB 11.2 (L) 09/22/2015   HCT 35.7 (L) 09/22/2015   MCV 95.5 09/22/2015   PLT 193 09/22/2015   NEUTROABS 3.7 07/12/2015      Assessment Plan:  Breast cancer of lower-outer quadrant of left female breast ([place]) Left lumpectomy 08/08/2015: Invasive lobular carcinoma, grade 2, 3.1 cm, broad-based was in the posterior, and inferior margin, LCIS,  perineural invasion present, no LV I, ER 80%, PR 0%, HER-2 negative, Ki-67 15%, T2 N1 a stage IIB Left mastectomy 09/21/2015: Residual invasive lobular cancer grade 2, perineural invasion, ALH Mammaprint: Low risk, luminal A, 10% risk of recurrence in 10 years  Current Treatment: Adjuvant antiestrogen therapy with anastrozole 1 mg by mouth daily 5-10 years started 10/22/15 Anastrozole Toxicities:. Patient denies any hot flashes or myalgias.  Surveillance: Mammograms:  09/30/2017: Benign, breast density category D  I renewed her prescription for anastrozole Return to clinic in 1 year for follow-up   I discussed the assessment and treatment plan with the patient. The patient was provided an opportunity to ask questions and all were answered. The patient agreed with the plan and demonstrated an understanding of the instructions. The patient was advised to call back or seek an in-person evaluation if the symptoms worsen or if the condition fails to improve as anticipated.   I provided 15 minutes of face-to-face Doximity time during this encounter.    Rulon Eisenmenger, MD 08/06/2018   I, Molly Dorshimer, am acting as scribe for Nicholas Lose, MD.  I have reviewed the above documentation for accuracy and completeness, and I agree with the above.

## 2018-08-05 NOTE — Telephone Encounter (Signed)
Called patient regarding upcoming Webex appointment, spoke with patient's husband and informed him patient will be having a Doximity call. Husband confirmed patient has smart phone and can receive text messages.

## 2018-08-06 ENCOUNTER — Inpatient Hospital Stay: Payer: Medicare Other | Attending: Hematology and Oncology | Admitting: Hematology and Oncology

## 2018-08-06 DIAGNOSIS — C50512 Malignant neoplasm of lower-outer quadrant of left female breast: Secondary | ICD-10-CM

## 2018-08-06 DIAGNOSIS — Z79811 Long term (current) use of aromatase inhibitors: Secondary | ICD-10-CM | POA: Diagnosis not present

## 2018-08-06 DIAGNOSIS — Z9012 Acquired absence of left breast and nipple: Secondary | ICD-10-CM

## 2018-08-06 DIAGNOSIS — Z170 Estrogen receptor positive status [ER+]: Secondary | ICD-10-CM | POA: Diagnosis not present

## 2018-08-06 MED ORDER — ANASTROZOLE 1 MG PO TABS: 1.0000 mg | ORAL_TABLET | Freq: Every day | ORAL | 3 refills | Status: DC

## 2018-08-11 ENCOUNTER — Ambulatory Visit: Payer: Medicare Other | Admitting: Sports Medicine

## 2018-08-11 ENCOUNTER — Other Ambulatory Visit: Payer: Self-pay

## 2018-08-11 ENCOUNTER — Ambulatory Visit (INDEPENDENT_AMBULATORY_CARE_PROVIDER_SITE_OTHER): Payer: Medicare Other

## 2018-08-11 ENCOUNTER — Other Ambulatory Visit: Payer: Self-pay | Admitting: Sports Medicine

## 2018-08-11 ENCOUNTER — Encounter: Payer: Self-pay | Admitting: Sports Medicine

## 2018-08-11 VITALS — BP 113/69 | HR 91 | Temp 97.5°F | Resp 16

## 2018-08-11 DIAGNOSIS — M779 Enthesopathy, unspecified: Secondary | ICD-10-CM | POA: Diagnosis not present

## 2018-08-11 DIAGNOSIS — M79672 Pain in left foot: Secondary | ICD-10-CM

## 2018-08-11 DIAGNOSIS — M19079 Primary osteoarthritis, unspecified ankle and foot: Secondary | ICD-10-CM

## 2018-08-11 DIAGNOSIS — L84 Corns and callosities: Secondary | ICD-10-CM | POA: Diagnosis not present

## 2018-08-11 DIAGNOSIS — M1990 Unspecified osteoarthritis, unspecified site: Secondary | ICD-10-CM

## 2018-08-11 DIAGNOSIS — M7752 Other enthesopathy of left foot: Secondary | ICD-10-CM | POA: Diagnosis not present

## 2018-08-11 DIAGNOSIS — M79671 Pain in right foot: Secondary | ICD-10-CM | POA: Diagnosis not present

## 2018-08-11 NOTE — Patient Instructions (Signed)
Voltaren gel 1% for arthritis to use daily on left foot for pain and inflammation

## 2018-08-11 NOTE — Progress Notes (Signed)
Subjective: Kiara Ruiz is a 73 y.o. female patient who presents to office for evaluation of Left>Right foot pain. Patient complains of progressive pain especially over the last 3 months in the left foot at the top of the foot that is worse after walking. Walks 3 miles 3x per week. Ranks pain 4-5/10 and is now interferring with daily activities. Patient has tried changing shoes with no relief in symptoms. Patient also admits to pain at a callus area on the right foot that she has had for 43 years that started after pregnancy. Tried epsom salt and OTC callus removal with improvement. Patient denies any other pedal complaints. Denies injury/trip/fall/sprain/any causative factors.   Review of Systems  Musculoskeletal: Positive for joint pain.  All other systems reviewed and are negative.    Patient Active Problem List   Diagnosis Date Noted  . Chronic cystitis 11/08/2017  . Breast cancer of lower-outer quadrant of left female breast ([place]) 07/04/2015  . Osteopenia   . Glaucoma     Current Outpatient Medications on File Prior to Visit  Medication Sig Dispense Refill  . anastrozole (ARIMIDEX) 1 MG tablet Take 1 tablet (1 mg total) by mouth daily. 90 tablet 3  . aspirin 81 MG tablet Take 81 mg by mouth daily.    Marland Kitchen CALCIUM-VITAMIN D PO Take 1 tablet by mouth daily.     Marland Kitchen latanoprost (XALATAN) 0.005 % ophthalmic solution Place 1 drop into both eyes at bedtime.     . Multiple Vitamin (MULTIVITAMIN PO) Take 1 tablet by mouth daily.     . timolol (BETIMOL) 0.5 % ophthalmic solution Place 1 drop into both eyes every morning.      No current facility-administered medications on file prior to visit.     No Known Allergies  Objective:  General: Alert and oriented x3 in no acute distress  Dermatology: No open lesions bilateral lower extremities, no webspace macerations, no ecchymosis bilateral, all nails x 10 are well manicured. + callus with nucleated core at right foot sub met 1 without signs  of infection.   Vascular: Dorsalis Pedis and Posterior Tibial pedal pulses palpable, Capillary Fill Time 3 seconds,(+) pedal hair growth bilateral, no edema bilateral lower extremities, Temperature gradient within normal limits.  Neurology: Johney Maine sensation intact via light touch bilateral.   Musculoskeletal: Mild tenderness with palpation at dorsal midfoot on the left with dorsal exostosis, bunion deformity bilateral.  Pes planus deformity bilateral.   Gait: Antalgic gait  Xrays  Left foot   Impression: Normal osseous mineralization there is joint space narrowing at the dorsal left midfoot supportive of arthritis with dorsal bone spur, there is significant bunion deformity and midtarsal breech supportive of pes planus.  No other acute findings.  Assessment and Plan: Problem List Items Addressed This Visit    None    Visit Diagnoses    Left foot pain    -  Primary   Arthritis of foot       Tendonitis       Foot callus       Right sub met 1    Right foot pain          -Complete examination performed -Xrays reviewed -Discussed treatement options for left foot pain likely secondary to arthritis versus tendinitis with chronic right foot callus -Patient declined oral medications or steroid injection at this time -Recommend patient to try using topical over-the-counter Voltaren gel for arthritis on the left foot and to stretch before and after exercise and  to continue with good supportive shoes -Mechanically debrided callus using a sterile chisel blade x1 at the plantar aspect of the right foot and applied Salinocaine is not sure  after ane and Band-Aid and advised patient to continue with good supportive shoes, pumice stone, and daily skin emollients to help decrease the recurrence of the callus. -Patient to return to office as needed or sooner if condition worsens.  Landis Martins, DPM

## 2018-09-03 ENCOUNTER — Other Ambulatory Visit: Payer: Self-pay | Admitting: Family Medicine

## 2018-09-03 DIAGNOSIS — Z1231 Encounter for screening mammogram for malignant neoplasm of breast: Secondary | ICD-10-CM

## 2018-10-16 ENCOUNTER — Other Ambulatory Visit: Payer: Self-pay

## 2018-10-16 ENCOUNTER — Ambulatory Visit
Admission: RE | Admit: 2018-10-16 | Discharge: 2018-10-16 | Disposition: A | Discharge status: Home / Self Care | Payer: Medicare Other | Source: Ambulatory Visit | Attending: Family Medicine | Admitting: Family Medicine

## 2018-10-16 DIAGNOSIS — Z1231 Encounter for screening mammogram for malignant neoplasm of breast: Secondary | ICD-10-CM

## 2018-12-09 ENCOUNTER — Encounter: Payer: Self-pay | Admitting: Gynecology

## 2019-03-31 DIAGNOSIS — I10 Essential (primary) hypertension: Secondary | ICD-10-CM | POA: Diagnosis not present

## 2019-03-31 DIAGNOSIS — R7309 Other abnormal glucose: Secondary | ICD-10-CM | POA: Diagnosis not present

## 2019-03-31 DIAGNOSIS — E782 Mixed hyperlipidemia: Secondary | ICD-10-CM | POA: Diagnosis not present

## 2019-04-01 DIAGNOSIS — E785 Hyperlipidemia, unspecified: Secondary | ICD-10-CM | POA: Diagnosis not present

## 2019-04-01 DIAGNOSIS — I10 Essential (primary) hypertension: Secondary | ICD-10-CM | POA: Diagnosis not present

## 2019-05-07 DIAGNOSIS — H35371 Puckering of macula, right eye: Secondary | ICD-10-CM | POA: Diagnosis not present

## 2019-05-07 DIAGNOSIS — H0100A Unspecified blepharitis right eye, upper and lower eyelids: Secondary | ICD-10-CM | POA: Diagnosis not present

## 2019-05-07 DIAGNOSIS — H35372 Puckering of macula, left eye: Secondary | ICD-10-CM | POA: Diagnosis not present

## 2019-05-07 DIAGNOSIS — H401132 Primary open-angle glaucoma, bilateral, moderate stage: Secondary | ICD-10-CM | POA: Diagnosis not present

## 2019-05-07 DIAGNOSIS — H35373 Puckering of macula, bilateral: Secondary | ICD-10-CM | POA: Diagnosis not present

## 2019-05-07 DIAGNOSIS — Z961 Presence of intraocular lens: Secondary | ICD-10-CM | POA: Diagnosis not present

## 2019-07-27 DIAGNOSIS — C50912 Malignant neoplasm of unspecified site of left female breast: Secondary | ICD-10-CM | POA: Diagnosis not present

## 2019-07-30 DIAGNOSIS — E785 Hyperlipidemia, unspecified: Secondary | ICD-10-CM | POA: Diagnosis not present

## 2019-07-30 DIAGNOSIS — I972 Postmastectomy lymphedema syndrome: Secondary | ICD-10-CM | POA: Diagnosis not present

## 2019-09-10 DIAGNOSIS — H35372 Puckering of macula, left eye: Secondary | ICD-10-CM | POA: Diagnosis not present

## 2019-09-10 DIAGNOSIS — H401132 Primary open-angle glaucoma, bilateral, moderate stage: Secondary | ICD-10-CM | POA: Diagnosis not present

## 2019-09-10 DIAGNOSIS — H35371 Puckering of macula, right eye: Secondary | ICD-10-CM | POA: Diagnosis not present

## 2019-09-10 DIAGNOSIS — H35373 Puckering of macula, bilateral: Secondary | ICD-10-CM | POA: Diagnosis not present

## 2019-09-13 ENCOUNTER — Other Ambulatory Visit: Payer: Self-pay | Admitting: Family Medicine

## 2019-09-13 DIAGNOSIS — Z1231 Encounter for screening mammogram for malignant neoplasm of breast: Secondary | ICD-10-CM

## 2019-10-04 ENCOUNTER — Other Ambulatory Visit: Payer: Self-pay | Admitting: Hematology and Oncology

## 2019-10-18 ENCOUNTER — Other Ambulatory Visit: Payer: Self-pay | Admitting: Family Medicine

## 2019-10-18 ENCOUNTER — Ambulatory Visit
Admission: RE | Admit: 2019-10-18 | Discharge: 2019-10-18 | Disposition: A | Discharge status: Home / Self Care | Payer: Medicare Other | Source: Ambulatory Visit | Attending: Family Medicine | Admitting: Family Medicine

## 2019-10-18 ENCOUNTER — Other Ambulatory Visit: Payer: Self-pay

## 2019-10-18 DIAGNOSIS — Z1231 Encounter for screening mammogram for malignant neoplasm of breast: Secondary | ICD-10-CM | POA: Diagnosis not present

## 2019-11-30 ENCOUNTER — Ambulatory Visit: Payer: Medicare PPO | Attending: Internal Medicine

## 2019-11-30 DIAGNOSIS — Z23 Encounter for immunization: Secondary | ICD-10-CM

## 2019-11-30 NOTE — Progress Notes (Signed)
   Covid-19 Vaccination Clinic  Name:  DANNA SEWELL    MRN: [id number] DOB: 1945/11/19  11/30/2019  Ms. Robotham was observed post Covid-19 immunization for 15 minutes without incident. She was provided with Vaccine Information Sheet and instruction to access the V-Safe system.   Ms. Monceaux was instructed to call 911 with any severe reactions post vaccine: Marland Kitchen Difficulty breathing  . Swelling of face and throat  . A fast heartbeat  . A bad rash all over body  . Dizziness and weakness

## 2019-12-02 DIAGNOSIS — I10 Essential (primary) hypertension: Secondary | ICD-10-CM | POA: Diagnosis not present

## 2019-12-02 DIAGNOSIS — I972 Postmastectomy lymphedema syndrome: Secondary | ICD-10-CM | POA: Diagnosis not present

## 2019-12-02 DIAGNOSIS — Z0000 Encounter for general adult medical examination without abnormal findings: Secondary | ICD-10-CM | POA: Diagnosis not present

## 2019-12-23 DIAGNOSIS — G3184 Mild cognitive impairment, so stated: Secondary | ICD-10-CM | POA: Diagnosis not present

## 2019-12-23 DIAGNOSIS — R030 Elevated blood-pressure reading, without diagnosis of hypertension: Secondary | ICD-10-CM | POA: Diagnosis not present

## 2019-12-23 DIAGNOSIS — H409 Unspecified glaucoma: Secondary | ICD-10-CM | POA: Diagnosis not present

## 2019-12-23 DIAGNOSIS — Z79811 Long term (current) use of aromatase inhibitors: Secondary | ICD-10-CM | POA: Diagnosis not present

## 2019-12-23 DIAGNOSIS — C50919 Malignant neoplasm of unspecified site of unspecified female breast: Secondary | ICD-10-CM | POA: Diagnosis not present

## 2020-01-07 DIAGNOSIS — I972 Postmastectomy lymphedema syndrome: Secondary | ICD-10-CM | POA: Diagnosis not present

## 2020-01-07 DIAGNOSIS — M5451 Vertebrogenic low back pain: Secondary | ICD-10-CM | POA: Diagnosis not present

## 2020-01-10 ENCOUNTER — Other Ambulatory Visit: Payer: Self-pay | Admitting: Family Medicine

## 2020-01-10 ENCOUNTER — Other Ambulatory Visit: Payer: Self-pay

## 2020-01-10 ENCOUNTER — Ambulatory Visit
Admission: RE | Admit: 2020-01-10 | Discharge: 2020-01-10 | Disposition: A | Discharge status: Home / Self Care | Payer: Medicare PPO | Source: Ambulatory Visit | Attending: Family Medicine | Admitting: Family Medicine

## 2020-01-10 DIAGNOSIS — R52 Pain, unspecified: Secondary | ICD-10-CM

## 2020-01-10 DIAGNOSIS — Z853 Personal history of malignant neoplasm of breast: Secondary | ICD-10-CM | POA: Diagnosis not present

## 2020-01-10 DIAGNOSIS — R0781 Pleurodynia: Secondary | ICD-10-CM | POA: Diagnosis not present

## 2020-01-10 DIAGNOSIS — M4328 Fusion of spine, sacral and sacrococcygeal region: Secondary | ICD-10-CM | POA: Diagnosis not present

## 2020-01-10 DIAGNOSIS — M545 Low back pain, unspecified: Secondary | ICD-10-CM | POA: Diagnosis not present

## 2020-01-10 DIAGNOSIS — M533 Sacrococcygeal disorders, not elsewhere classified: Secondary | ICD-10-CM | POA: Diagnosis not present

## 2020-01-10 DIAGNOSIS — Z9012 Acquired absence of left breast and nipple: Secondary | ICD-10-CM | POA: Diagnosis not present

## 2020-01-11 DIAGNOSIS — Z961 Presence of intraocular lens: Secondary | ICD-10-CM | POA: Diagnosis not present

## 2020-01-11 DIAGNOSIS — H35371 Puckering of macula, right eye: Secondary | ICD-10-CM | POA: Diagnosis not present

## 2020-01-11 DIAGNOSIS — H401132 Primary open-angle glaucoma, bilateral, moderate stage: Secondary | ICD-10-CM | POA: Diagnosis not present

## 2020-01-14 DIAGNOSIS — M5451 Vertebrogenic low back pain: Secondary | ICD-10-CM | POA: Diagnosis not present

## 2020-01-14 DIAGNOSIS — M461 Sacroiliitis, not elsewhere classified: Secondary | ICD-10-CM | POA: Diagnosis not present

## 2020-01-14 DIAGNOSIS — M5136 Other intervertebral disc degeneration, lumbar region: Secondary | ICD-10-CM | POA: Diagnosis not present

## 2020-01-14 DIAGNOSIS — M8530 Osteitis condensans, unspecified site: Secondary | ICD-10-CM | POA: Diagnosis not present

## 2020-02-03 DIAGNOSIS — M4316 Spondylolisthesis, lumbar region: Secondary | ICD-10-CM | POA: Diagnosis not present

## 2020-02-03 DIAGNOSIS — M545 Low back pain, unspecified: Secondary | ICD-10-CM | POA: Diagnosis not present

## 2020-02-03 DIAGNOSIS — M47816 Spondylosis without myelopathy or radiculopathy, lumbar region: Secondary | ICD-10-CM | POA: Diagnosis not present

## 2020-02-11 DIAGNOSIS — Z853 Personal history of malignant neoplasm of breast: Secondary | ICD-10-CM | POA: Diagnosis not present

## 2020-02-25 DIAGNOSIS — M5451 Vertebrogenic low back pain: Secondary | ICD-10-CM | POA: Diagnosis not present

## 2020-02-25 DIAGNOSIS — I972 Postmastectomy lymphedema syndrome: Secondary | ICD-10-CM | POA: Diagnosis not present

## 2020-04-27 DIAGNOSIS — E785 Hyperlipidemia, unspecified: Secondary | ICD-10-CM | POA: Diagnosis not present

## 2020-04-27 DIAGNOSIS — I10 Essential (primary) hypertension: Secondary | ICD-10-CM | POA: Diagnosis not present

## 2020-07-17 ENCOUNTER — Telehealth: Payer: Self-pay | Admitting: Hematology and Oncology

## 2020-07-17 NOTE — Telephone Encounter (Signed)
Scheduled appt per 5/16 sch msg. Pt aware.  

## 2020-08-07 NOTE — Progress Notes (Signed)
Patient Care Team: Lucianne Lei, MD as PCP - General (Family Medicine)  DIAGNOSIS:    ICD-10-CM   1. Malignant neoplasm of lower-outer quadrant of left breast of female, estrogen receptor positive ([place])  C50.512    Z17.0     SUMMARY OF ONCOLOGIC HISTORY: Oncology History  Breast cancer of lower-outer quadrant of left female breast ([place])  06/29/2015 Initial Diagnosis   Left breast biopsy LOQ: invasive lobular carcinoma, grade 1, ER 80%, PR 0%, Ki-67 15%, HER-2 negative ratio 1.24; screening detected distortion without ultrasound correlate    07/10/2015 Breast MRI   Left breast posterior lower outer quadrant: Irregular mass and NME 4.3 x 2.3 x 2.2 cm, no lymph nodes, T2 N0 stage II a clinical stage   08/08/2015 Surgery   Left lumpectomy ([place]): Invasive lobular carcinoma, grade 2, 3.1 cm, broad-based was in the posterior, and inferior margin, LCIS, perineural invasion present, no LV I, ER 80%, PR 0%, HER-2 negative, Ki-67 15%, T2 N1 a stage IIB   08/08/2015 Miscellaneous   Mammaprint: Low risk, luminal type a, 10 year risk of recurrence 10%   09/21/2015 Surgery   Left mastectomy: Invasive lobular cancer, grade 2, ALH, PNI present,    10/22/2015 -  Anti-estrogen oral therapy   Anastrozole 1 mg daily     CHIEF COMPLIANT: Follow-up of left breast cancer on anastrozole therapy  INTERVAL HISTORY: Kiara Ruiz is a 75 y.o. with above-mentioned history of left breast cancer treated with initial lumpectomy followed by mastectomy. She is currently on oral antiestrogen therapy with anastrozole. Mammogram on 10/18/19 showed no evidence of malignancy. She presents to the clinic today for annual follow-up.   ALLERGIES:  has No Known Allergies.  MEDICATIONS:  Current Outpatient Medications  Medication Sig Dispense Refill  . anastrozole (ARIMIDEX) 1 MG tablet TAKE 1 TABLET BY MOUTH EVERY DAY 90 tablet 3  . aspirin 81 MG tablet Take 81 mg by mouth daily.    Marland Kitchen CALCIUM-VITAMIN D PO Take 1  tablet by mouth daily.     Marland Kitchen latanoprost (XALATAN) 0.005 % ophthalmic solution Place 1 drop into both eyes at bedtime.     . Multiple Vitamin (MULTIVITAMIN PO) Take 1 tablet by mouth daily.     . timolol (BETIMOL) 0.5 % ophthalmic solution Place 1 drop into both eyes every morning.      No current facility-administered medications for this visit.    PHYSICAL EXAMINATION: ECOG PERFORMANCE STATUS: 1 - Symptomatic but completely ambulatory  There were no vitals filed for this visit. There were no vitals filed for this visit.  BREAST: No palpable masses or nodules in either right or left breasts. No palpable axillary supraclavicular or infraclavicular adenopathy no breast tenderness or nipple discharge. (exam performed in the presence of a chaperone)  LABORATORY DATA:  I have reviewed the data as listed CMP Latest Ref Rng & Units 09/21/2015 07/12/2015  Glucose 70 - 140 mg/dl - 82  BUN 7.0 - 26.0 mg/dL - 11.2  Creatinine 0.44 - 1.00 mg/dL 0.86 0.9  Sodium 136 - 145 mEq/L - 142  Potassium 3.5 - 5.1 mEq/L - 4.4  CO2 22 - 29 mEq/L - 28  Calcium 8.4 - 10.4 mg/dL - 9.7  Total Protein 6.4 - 8.3 g/dL - 7.5  Total Bilirubin 0.20 - 1.20 mg/dL - 0.50  Alkaline Phos 40 - 150 U/L - 73  AST 5 - 34 U/L - 15  ALT 0 - 55 U/L - 13    Lab Results  Component Value Date   WBC 9.0 09/22/2015   HGB 11.2 (L) 09/22/2015   HCT 35.7 (L) 09/22/2015   MCV 95.5 09/22/2015   PLT 193 09/22/2015   NEUTROABS 3.7 07/12/2015    ASSESSMENT & PLAN:  Breast cancer of lower-outer quadrant of left female breast ([place]) Left lumpectomy 08/08/2015: Invasive lobular carcinoma, grade 2, 3.1 cm, broad-based was in the posterior, and inferior margin, LCIS, perineural invasion present, no LV I, ER 80%, PR 0%, HER-2 negative, Ki-67 15%, T2 N1 a stage IIB Left mastectomy 09/21/2015: Residual invasive lobular cancer grade 2, perineural invasion, ALH Mammaprint: Low risk, luminal A, 10% risk of recurrence in 10 years  Current  Treatment: Adjuvant antiestrogen therapy with anastrozole 1 mg by mouth daily for 10 years started 10/22/15 Anastrozole Toxicities:. Patient denies any hot flashes or myalgias.  Surveillance: Mammograms: 10/19/2019: Benign, breast density category D  I renewed her prescription for anastrozole Return to clinic in 1 year for follow-up     No orders of the defined types were placed in this encounter.  The patient has a good understanding of the overall plan. she agrees with it. she will call with any problems that may develop before the next visit here.  Total time spent: 20 mins including face to face time and time spent for planning, charting and coordination of care  Rulon Eisenmenger, MD, MPH 08/08/2020  I, Cloyde Reams Dorshimer, am acting as scribe for Dr. Nicholas Lose.  I have reviewed the above documentation for accuracy and completeness, and I agree with the above.

## 2020-08-08 ENCOUNTER — Other Ambulatory Visit: Payer: Self-pay

## 2020-08-08 ENCOUNTER — Inpatient Hospital Stay: Payer: Medicare PPO | Attending: Hematology and Oncology | Admitting: Hematology and Oncology

## 2020-08-08 DIAGNOSIS — H401132 Primary open-angle glaucoma, bilateral, moderate stage: Secondary | ICD-10-CM | POA: Diagnosis not present

## 2020-08-08 DIAGNOSIS — Z7982 Long term (current) use of aspirin: Secondary | ICD-10-CM | POA: Diagnosis not present

## 2020-08-08 DIAGNOSIS — Z961 Presence of intraocular lens: Secondary | ICD-10-CM | POA: Diagnosis not present

## 2020-08-08 DIAGNOSIS — Z79811 Long term (current) use of aromatase inhibitors: Secondary | ICD-10-CM | POA: Insufficient documentation

## 2020-08-08 DIAGNOSIS — H35371 Puckering of macula, right eye: Secondary | ICD-10-CM | POA: Diagnosis not present

## 2020-08-08 DIAGNOSIS — C50512 Malignant neoplasm of lower-outer quadrant of left female breast: Secondary | ICD-10-CM | POA: Diagnosis not present

## 2020-08-08 DIAGNOSIS — Z170 Estrogen receptor positive status [ER+]: Secondary | ICD-10-CM | POA: Insufficient documentation

## 2020-08-08 DIAGNOSIS — Z79899 Other long term (current) drug therapy: Secondary | ICD-10-CM | POA: Diagnosis not present

## 2020-08-08 DIAGNOSIS — H35372 Puckering of macula, left eye: Secondary | ICD-10-CM | POA: Diagnosis not present

## 2020-08-08 DIAGNOSIS — H0100A Unspecified blepharitis right eye, upper and lower eyelids: Secondary | ICD-10-CM | POA: Diagnosis not present

## 2020-08-08 MED ORDER — ANASTROZOLE 1 MG PO TABS: 1.0000 mg | ORAL_TABLET | Freq: Every day | ORAL | 3 refills | Status: DC

## 2020-08-08 NOTE — Assessment & Plan Note (Signed)
Left lumpectomy 08/08/2015: Invasive lobular carcinoma, grade 2, 3.1 cm, broad-based was in the posterior, and inferior margin, LCIS, perineural invasion present, no LV I, ER 80%, PR 0%, HER-2 negative, Ki-67 15%, T2 N1 a stage IIB Left mastectomy 09/21/2015: Residual invasive lobular cancer grade 2, perineural invasion, ALH Mammaprint: Low risk, luminal A, 10% risk of recurrence in 10 years  Current Treatment: Adjuvant antiestrogen therapy with anastrozole 1 mg by mouth daily 5-10 years started 10/22/15 Anastrozole Toxicities:. Patient denies any hot flashes or myalgias.  Surveillance: Mammograms: 10/19/2019: Benign, breast density category D  I renewed her prescription for anastrozole Return to clinic in 1 year for follow-up

## 2020-08-17 DIAGNOSIS — C50912 Malignant neoplasm of unspecified site of left female breast: Secondary | ICD-10-CM | POA: Diagnosis not present

## 2020-08-25 DIAGNOSIS — I10 Essential (primary) hypertension: Secondary | ICD-10-CM | POA: Diagnosis not present

## 2020-08-25 DIAGNOSIS — E782 Mixed hyperlipidemia: Secondary | ICD-10-CM | POA: Diagnosis not present

## 2020-08-25 DIAGNOSIS — M130 Polyarthritis, unspecified: Secondary | ICD-10-CM | POA: Diagnosis not present

## 2020-09-11 DIAGNOSIS — S39012S Strain of muscle, fascia and tendon of lower back, sequela: Secondary | ICD-10-CM | POA: Diagnosis not present

## 2020-09-11 DIAGNOSIS — M545 Low back pain, unspecified: Secondary | ICD-10-CM | POA: Diagnosis not present

## 2020-09-11 DIAGNOSIS — R262 Difficulty in walking, not elsewhere classified: Secondary | ICD-10-CM | POA: Diagnosis not present

## 2020-09-15 DIAGNOSIS — S39012S Strain of muscle, fascia and tendon of lower back, sequela: Secondary | ICD-10-CM | POA: Diagnosis not present

## 2020-09-15 DIAGNOSIS — M545 Low back pain, unspecified: Secondary | ICD-10-CM | POA: Diagnosis not present

## 2020-09-15 DIAGNOSIS — R262 Difficulty in walking, not elsewhere classified: Secondary | ICD-10-CM | POA: Diagnosis not present

## 2020-09-19 DIAGNOSIS — M545 Low back pain, unspecified: Secondary | ICD-10-CM | POA: Diagnosis not present

## 2020-09-19 DIAGNOSIS — S39012S Strain of muscle, fascia and tendon of lower back, sequela: Secondary | ICD-10-CM | POA: Diagnosis not present

## 2020-09-19 DIAGNOSIS — R262 Difficulty in walking, not elsewhere classified: Secondary | ICD-10-CM | POA: Diagnosis not present

## 2020-09-21 DIAGNOSIS — R262 Difficulty in walking, not elsewhere classified: Secondary | ICD-10-CM | POA: Diagnosis not present

## 2020-09-21 DIAGNOSIS — M545 Low back pain, unspecified: Secondary | ICD-10-CM | POA: Diagnosis not present

## 2020-09-21 DIAGNOSIS — S39012S Strain of muscle, fascia and tendon of lower back, sequela: Secondary | ICD-10-CM | POA: Diagnosis not present

## 2020-09-25 DIAGNOSIS — E785 Hyperlipidemia, unspecified: Secondary | ICD-10-CM | POA: Diagnosis not present

## 2020-09-25 DIAGNOSIS — M130 Polyarthritis, unspecified: Secondary | ICD-10-CM | POA: Diagnosis not present

## 2020-09-25 DIAGNOSIS — E559 Vitamin D deficiency, unspecified: Secondary | ICD-10-CM | POA: Diagnosis not present

## 2020-09-25 DIAGNOSIS — E782 Mixed hyperlipidemia: Secondary | ICD-10-CM | POA: Diagnosis not present

## 2020-09-25 DIAGNOSIS — I972 Postmastectomy lymphedema syndrome: Secondary | ICD-10-CM | POA: Diagnosis not present

## 2020-09-25 DIAGNOSIS — M461 Sacroiliitis, not elsewhere classified: Secondary | ICD-10-CM | POA: Diagnosis not present

## 2020-09-25 DIAGNOSIS — M47817 Spondylosis without myelopathy or radiculopathy, lumbosacral region: Secondary | ICD-10-CM | POA: Diagnosis not present

## 2020-09-25 DIAGNOSIS — I10 Essential (primary) hypertension: Secondary | ICD-10-CM | POA: Diagnosis not present

## 2020-09-26 DIAGNOSIS — S39012S Strain of muscle, fascia and tendon of lower back, sequela: Secondary | ICD-10-CM | POA: Diagnosis not present

## 2020-09-26 DIAGNOSIS — M545 Low back pain, unspecified: Secondary | ICD-10-CM | POA: Diagnosis not present

## 2020-09-26 DIAGNOSIS — R262 Difficulty in walking, not elsewhere classified: Secondary | ICD-10-CM | POA: Diagnosis not present

## 2020-09-27 ENCOUNTER — Other Ambulatory Visit: Payer: Self-pay | Admitting: Family Medicine

## 2020-09-27 DIAGNOSIS — Z1231 Encounter for screening mammogram for malignant neoplasm of breast: Secondary | ICD-10-CM

## 2020-09-29 DIAGNOSIS — M545 Low back pain, unspecified: Secondary | ICD-10-CM | POA: Diagnosis not present

## 2020-09-29 DIAGNOSIS — R262 Difficulty in walking, not elsewhere classified: Secondary | ICD-10-CM | POA: Diagnosis not present

## 2020-09-29 DIAGNOSIS — S39012S Strain of muscle, fascia and tendon of lower back, sequela: Secondary | ICD-10-CM | POA: Diagnosis not present

## 2020-10-03 DIAGNOSIS — R262 Difficulty in walking, not elsewhere classified: Secondary | ICD-10-CM | POA: Diagnosis not present

## 2020-10-03 DIAGNOSIS — M545 Low back pain, unspecified: Secondary | ICD-10-CM | POA: Diagnosis not present

## 2020-10-03 DIAGNOSIS — S39012S Strain of muscle, fascia and tendon of lower back, sequela: Secondary | ICD-10-CM | POA: Diagnosis not present

## 2020-10-05 DIAGNOSIS — S39012S Strain of muscle, fascia and tendon of lower back, sequela: Secondary | ICD-10-CM | POA: Diagnosis not present

## 2020-10-05 DIAGNOSIS — R262 Difficulty in walking, not elsewhere classified: Secondary | ICD-10-CM | POA: Diagnosis not present

## 2020-10-05 DIAGNOSIS — M545 Low back pain, unspecified: Secondary | ICD-10-CM | POA: Diagnosis not present

## 2020-10-09 DIAGNOSIS — M5126 Other intervertebral disc displacement, lumbar region: Secondary | ICD-10-CM | POA: Diagnosis not present

## 2020-10-10 DIAGNOSIS — R262 Difficulty in walking, not elsewhere classified: Secondary | ICD-10-CM | POA: Diagnosis not present

## 2020-10-10 DIAGNOSIS — M545 Low back pain, unspecified: Secondary | ICD-10-CM | POA: Diagnosis not present

## 2020-10-10 DIAGNOSIS — S39012S Strain of muscle, fascia and tendon of lower back, sequela: Secondary | ICD-10-CM | POA: Diagnosis not present

## 2020-10-12 DIAGNOSIS — M545 Low back pain, unspecified: Secondary | ICD-10-CM | POA: Diagnosis not present

## 2020-10-12 DIAGNOSIS — M5126 Other intervertebral disc displacement, lumbar region: Secondary | ICD-10-CM | POA: Diagnosis not present

## 2020-10-16 DIAGNOSIS — R030 Elevated blood-pressure reading, without diagnosis of hypertension: Secondary | ICD-10-CM | POA: Diagnosis not present

## 2020-10-16 DIAGNOSIS — Z6827 Body mass index (BMI) 27.0-27.9, adult: Secondary | ICD-10-CM | POA: Diagnosis not present

## 2020-10-16 DIAGNOSIS — M5126 Other intervertebral disc displacement, lumbar region: Secondary | ICD-10-CM | POA: Diagnosis not present

## 2020-10-18 DIAGNOSIS — M48062 Spinal stenosis, lumbar region with neurogenic claudication: Secondary | ICD-10-CM | POA: Diagnosis not present

## 2020-10-18 DIAGNOSIS — M5416 Radiculopathy, lumbar region: Secondary | ICD-10-CM | POA: Diagnosis not present

## 2020-11-01 DIAGNOSIS — M48062 Spinal stenosis, lumbar region with neurogenic claudication: Secondary | ICD-10-CM | POA: Diagnosis not present

## 2020-11-15 ENCOUNTER — Ambulatory Visit: Payer: Medicare PPO

## 2020-11-22 DIAGNOSIS — M48062 Spinal stenosis, lumbar region with neurogenic claudication: Secondary | ICD-10-CM | POA: Diagnosis not present

## 2020-11-29 ENCOUNTER — Other Ambulatory Visit: Payer: Self-pay | Admitting: Family Medicine

## 2020-11-29 DIAGNOSIS — R4182 Altered mental status, unspecified: Secondary | ICD-10-CM

## 2020-12-01 ENCOUNTER — Other Ambulatory Visit: Payer: Self-pay

## 2020-12-01 ENCOUNTER — Ambulatory Visit
Admission: RE | Admit: 2020-12-01 | Discharge: 2020-12-01 | Disposition: A | Discharge status: Home / Self Care | Payer: Medicare PPO | Source: Ambulatory Visit | Attending: Family Medicine | Admitting: Family Medicine

## 2020-12-01 DIAGNOSIS — Z1231 Encounter for screening mammogram for malignant neoplasm of breast: Secondary | ICD-10-CM

## 2020-12-05 DIAGNOSIS — Z961 Presence of intraocular lens: Secondary | ICD-10-CM | POA: Diagnosis not present

## 2020-12-05 DIAGNOSIS — H35373 Puckering of macula, bilateral: Secondary | ICD-10-CM | POA: Diagnosis not present

## 2020-12-05 DIAGNOSIS — H401132 Primary open-angle glaucoma, bilateral, moderate stage: Secondary | ICD-10-CM | POA: Diagnosis not present

## 2020-12-14 ENCOUNTER — Ambulatory Visit
Admission: RE | Admit: 2020-12-14 | Discharge: 2020-12-14 | Disposition: A | Discharge status: Home / Self Care | Payer: Medicare PPO | Source: Ambulatory Visit | Attending: Family Medicine | Admitting: Family Medicine

## 2020-12-14 ENCOUNTER — Other Ambulatory Visit: Payer: Medicare PPO

## 2020-12-14 DIAGNOSIS — R4182 Altered mental status, unspecified: Secondary | ICD-10-CM

## 2020-12-14 DIAGNOSIS — R413 Other amnesia: Secondary | ICD-10-CM | POA: Diagnosis not present

## 2020-12-14 DIAGNOSIS — R443 Hallucinations, unspecified: Secondary | ICD-10-CM | POA: Diagnosis not present

## 2020-12-19 DIAGNOSIS — I776 Arteritis, unspecified: Secondary | ICD-10-CM | POA: Diagnosis not present

## 2020-12-21 ENCOUNTER — Encounter: Payer: Self-pay | Admitting: Psychology

## 2021-01-09 DIAGNOSIS — R7309 Other abnormal glucose: Secondary | ICD-10-CM | POA: Diagnosis not present

## 2021-01-09 DIAGNOSIS — I10 Essential (primary) hypertension: Secondary | ICD-10-CM | POA: Diagnosis not present

## 2021-01-09 DIAGNOSIS — I972 Postmastectomy lymphedema syndrome: Secondary | ICD-10-CM | POA: Diagnosis not present

## 2021-01-09 DIAGNOSIS — R410 Disorientation, unspecified: Secondary | ICD-10-CM | POA: Diagnosis not present

## 2021-01-09 DIAGNOSIS — L2389 Allergic contact dermatitis due to other agents: Secondary | ICD-10-CM | POA: Diagnosis not present

## 2021-02-09 DIAGNOSIS — I10 Essential (primary) hypertension: Secondary | ICD-10-CM | POA: Diagnosis not present

## 2021-02-09 DIAGNOSIS — E1169 Type 2 diabetes mellitus with other specified complication: Secondary | ICD-10-CM | POA: Diagnosis not present

## 2021-04-10 DIAGNOSIS — H401132 Primary open-angle glaucoma, bilateral, moderate stage: Secondary | ICD-10-CM | POA: Diagnosis not present

## 2021-04-10 DIAGNOSIS — H35373 Puckering of macula, bilateral: Secondary | ICD-10-CM | POA: Diagnosis not present

## 2021-04-10 DIAGNOSIS — Z961 Presence of intraocular lens: Secondary | ICD-10-CM | POA: Diagnosis not present

## 2021-05-23 ENCOUNTER — Encounter: Payer: Self-pay | Admitting: Psychology

## 2021-05-23 ENCOUNTER — Other Ambulatory Visit: Payer: Self-pay

## 2021-05-23 ENCOUNTER — Encounter: Payer: Medicare PPO | Attending: Psychology | Admitting: Psychology

## 2021-05-23 DIAGNOSIS — F09 Unspecified mental disorder due to known physiological condition: Secondary | ICD-10-CM | POA: Insufficient documentation

## 2021-05-23 DIAGNOSIS — G3184 Mild cognitive impairment, so stated: Secondary | ICD-10-CM | POA: Diagnosis not present

## 2021-05-23 NOTE — Progress Notes (Signed)
Neuropsychological Consultation ? ? ?Patient:   Kiara Ruiz  ? ?DOB:   02-25-1946 ? ?MR Number:  [id number] ? ?Location:  [facility] ?[place] [place] ?[id number] ?[place] [address] ?Dept: [phone] ?          ?Date of Service:   05/24/2018 ? ?Start Time:   1 PM ?End Time:   3 PM ? ?Today's visit was an in person visit that was conducted in my outpatient clinic office.  1 hour and 15 minutes was spent in face-to-face clinical interview with the patient alone for the first 60 minutes and 15 minutes was spent with the patient's husband to get his perspective.  The other 45 minutes was spent with records review, report writing and setting up testing protocols. ? ?Provider/Observer:  Ilean Skill, Psy.D.   ?    Clinical Neuropsychologist ?     ? ?Billing Code/Service: [id number] ? ?Chief Complaint:    Kiara Ruiz is a 76 year old female referred for a neuropsychological evaluation by the patient's primary care provider Lucianne Lei, MD because of ongoing and worsening issues with memory and learning, changes in motor skills and motor functioning and increased confusion.  The patient has been having significant trouble keeping up with things like her checkbook, working on a computer and operating her phone.  There is also been reported change in mood with sudden agitation described by her husband.  There have also been visual hallucinations at time reported by the patient's husband.  The patient tends to minimize any difficulties but does not question her husband's description.  She does admit to noticing some short-term memory issues but also added "I seem normal to me."  Patient does have a past medical history including breast cancer and is now in remission. ? ?Reason for Service:  Kiara Ruiz is a 76 year old female referred for a neuropsychological evaluation by the patient's primary care  provider Lucianne Lei, MD because of ongoing and worsening issues with memory and learning, changes in motor skills and motor functioning and increased confusion.  The patient has been having significant trouble keeping up with things like her checkbook, working on a computer and operating her phone.  There is also been reported change in mood with sudden agitation described by her husband.  There have also been visual hallucinations at time reported by the patient's husband.  The patient tends to minimize any difficulties but does not question her husband's description.  She does admit to noticing some short-term memory issues but also added "I seem normal to me.  "Patient does have a past medical history including breast cancer and is now in remission. ? ?Past medical history includes breast cancer, chronic cystitis, osteopenia and glaucoma.  The patient had a head CT scan without contrast conducted on 12/14/2020 following an episode of altered mental status.  The interpretive results included mild generalized cerebral atrophy along with mild to moderate patchy and ill-defined hypoattenuation within the cerebral white matter, nonspecific but compatible with chronic small vessel ischemic disease.  There was no evidence of acute intercranial abnormality. ? ?The patient's husband identifies greater issues than the patient herself identifies and she was somewhat reticent in describing any perceived problems.  The patient's husband reports that roughly 2 years ago she had significant pain that sounds like it was similar to psoriatic nerve pain.  The patient had a lot of difficulties during this time but after approximately  6 months the pain improved and dissipated.  He describes the patient is not sleeping well at night and will get very confused particularly at night.  He reports there are times when his wife will wake up and not know who he is and appear quite distressed.  Patient's husband also reports that she is  having difficulty keeping up with the checkbook, navigating the computer or phone.  He also reports that there are sudden changes in her mood at times and behavioral changes and will become agitated at times. ? ?The patient reports that overall her sleep is adequate and has improved.  The patient reports that there have been some significant times of not sleeping well but she has been instructed by her physician not to take naps during the day and feels like it is helpful.  Patient's husband describes some significant snoring by her and there is a family history of significant sleep apnea.  Her appetite is described as good. ? ?Behavioral Observation: Kiara Ruiz  presents as a 76 y.o.-year-old Right handed African American Female who appeared her stated age. her dress was Appropriate and she was Well Groomed and her manners were Appropriate to the situation.  her participation was indicative of Appropriate and Redirectable behaviors.  There were not physical disabilities noted.  she displayed an appropriate level of cooperation and motivation.   ? ? ?Interactions:    Active Appropriate ? ?Attention:   abnormal and attention span appeared shorter than expected for age ? ?Memory:   normal; remote memory intact, recent memory impaired ? ?Visuo-spatial:  not examined ? ?Speech (Volume):  normal ? ?Speech:   normal; normal ? ?Thought Process:  Coherent and Tangential ? ?Though Content:  WNL; not suicidal and not homicidal ? ?Orientation:   person, place, and time/date ? ?Judgment:   Fair ? ?Planning:   Poor ? ?Affect:    Appropriate ? ?Mood:    Anxious ? ?Insight:   Good ? ?Intelligence:   high ? ?Marital Status/Living: The patient was born and raised in [place] along with 3 siblings.  She lives with her husband of 43 years.  No other marriages noted.  The patient has 1 adult child reported as in good health. ? ?Current Employment: The patient is retired. ? 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 ? ?Substance Use:  No concerns of substance abuse are reported. ? 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  She attended [employer], [place] and [place]. ? ?Medical History:   ?Past Medical History:  ?Diagnosis Date  ? Arthritis   ? "mainly my feet/toes" (09/21/2015)  ? ASCUS favor benign 09/2013  ? Negative HR HPV  ? Breast cancer of lower-outer quadrant of left female breast ([place]) 07/04/2015  ? GERD (gastroesophageal reflux disease)   ? hx of, none recently  ? Glaucoma, both eyes   ? Osteopenia 03/2017  ? T score -1.7 FRAX 4.9% / 0.9%  ? Trigger finger   ? ? ?     ?Patient Active Problem List  ? Diagnosis Date Noted  ? Chronic cystitis 11/08/2017  ? Breast cancer of lower-outer quadrant of left female breast ([place]) 07/04/2015  ? Osteopenia   ? Glaucoma   ? ? ? ?Psychiatric History:  No prior psychiatric history noted. ? ?Family Med/Psych History:  ?Family History  ?  Problem Relation Age of Onset  ? Diabetes Mother   ? Cancer Mother 72  ?     PANCREATIC  ? Hypertension Father   ? Diabetes Brother   ? Diabetes Brother   ? ?Impression/DX:  Kiara Ruiz is a 76 year old female referred for a neuropsychological evaluation by the patient's primary care provider Lucianne Lei, MD because of ongoing and worsening issues with memory and learning, changes in motor skills and motor functioning and increased confusion.  The patient has been having significant trouble keeping up with things like her checkbook, working on a computer and operating her phone.  There is also been reported change in mood with sudden agitation described by her husband.  There have also been visual hallucinations at time reported by the patient's husband.  The patient tends to minimize any difficulties but does not question her husband's description.  She does admit to  noticing some short-term memory issues but also added "I seem normal to me."  Patient does have a past medical history including breast cancer and is now in remission. ? ?Disposition/Plan:  We have set

## 2021-06-07 ENCOUNTER — Encounter: Payer: Medicare PPO | Attending: Psychology

## 2021-06-07 DIAGNOSIS — F0280 Dementia in other diseases classified elsewhere without behavioral disturbance: Secondary | ICD-10-CM | POA: Diagnosis not present

## 2021-06-07 DIAGNOSIS — F09 Unspecified mental disorder due to known physiological condition: Secondary | ICD-10-CM | POA: Diagnosis present

## 2021-06-07 DIAGNOSIS — R4189 Other symptoms and signs involving cognitive functions and awareness: Secondary | ICD-10-CM | POA: Insufficient documentation

## 2021-06-07 DIAGNOSIS — I6782 Cerebral ischemia: Secondary | ICD-10-CM | POA: Insufficient documentation

## 2021-06-07 NOTE — Progress Notes (Signed)
? ?Behavioral Observations ?The patient arrived 30 minutes early to her testing appointment. She appeared well-groomed and well-dressed. Her manners were polite and appropriate to the situation. The patient was soft-spoken and exhibited a gentle demeanor. She required frequent repetition throughout the test and had significant difficulty understanding instructions.  ? ?Neuropsychology Note ? ?Kiara Ruiz completed 150 minutes of neuropsychological testing with technician, Dina Rich, BA, under the supervision of Ilean Skill, PsyD., Clinical Neuropsychologist. The patient did not appear overtly distressed by the testing session, per behavioral observation or via self-report to the technician. Rest breaks were offered.  ? ?Clinical Decision Making: In considering the patient's current level of functioning, level of presumed impairment, nature of symptoms, emotional and behavioral responses during clinical interview, level of literacy, and observed level of motivation/effort, a battery of tests was selected by Dr. Sima Matas during initial consultation on 05/23/2021. This was communicated to the technician. Communication between the neuropsychologist and technician was ongoing throughout the testing session and changes were made as deemed necessary based on patient performance on testing, technician observations and additional pertinent factors such as those listed above. ? ?Tests Administered: ?Grooved Pegboard ?Wechsler Adult Intelligence Scale, 4th Edition (WAIS-IV) ?Wechsler Memory Scale, 4th Edition (WMS-IV); Older Adult Battery  ? ?Results: ? ?Grooved Pegboard ?Right ?Time = 2:33.22 ?0 drops ?Left ?Time = 3:50.17 ?1 drop ? ? ? ? ? ? ?WAIS-IV ? ?Composite Score Summary  ?Scale Sum of ?Scaled Scores Composite ?Score Percentile ?Rank 95% Conf. ?Interval Qualitative Description  ?Verbal Comprehension 23 VCI 87 19 82-93 Low Average  ?Perceptual Reasoning 10 PRI 60 0.4 56-68 Extremely Low  ?Working Memory 8  WMI 66 1 61-75 Extremely Low  ?Processing Speed 4 PSI 56 0.2 52-69 Extremely Low  ?Full Scale 45 FSIQ 63 1 60-68 Extremely Low  ?General Ability 33 GAI 72 3 68-78 Borderline  ? ? ? ? ? ?Verbal Comprehension Subtests Summary  ?Subtest Raw Score Scaled Score Percentile Rank Reference Group Scaled Score SEM  ?Similarities '17 8 25 6 '$ 1.12  ?Vocabulary 31 9 37 9 0.73  ?Information '6 6 9 6 '$ 0.73  ?(Comprehension) '18 8 25 7 '$ 1.08  ? ? ? ? ? ? ?Perceptual Reasoning Subtests Summary  ?Subtest Raw Score Scaled Score Percentile Rank Reference Group Scaled Score SEM  ?Block Design '7 3 1 2 '$ 1.27  ?Matrix Reasoning '4 5 5 1 '$ 0.73  ?Visual Puzzles 1 2 0.4 1 0.99  ?(Picture Completion) '5 7 16 4 '$ 1.12  ? ? ? ? ? ? ?Working Wellsite geologist  ?Subtest Raw Score Scaled Score Percentile Rank Reference Group Scaled Score SEM  ?Digit Span '12 3 1 2 '$ 0.79  ?Arithmetic '7 5 5 5 '$ 0.95  ? ? ? ? ? ? ?Processing Speed Subtests Summary  ?Subtest Raw Score Scaled Score Percentile Rank Reference Group Scaled Score SEM  ?Symbol Search '5 3 1 1 '$ 1.12  ?Coding 4 1 0.1 1 1.12  ? ? ? ? ? ? ? ?WMS-IV Older Adult ? ? ?Index Score Summary  ?Index Sum of Scaled Scores Index Score Percentile ?Rank 95% Confidence ?Interval Qualitative Descriptor  ?Auditory Memory (AMI) 25 78 7 73-85 Borderline  ?Visual Memory (VMI) 7 62 1 58-68 Extremely Low  ?Immediate Memory (IMI) 18 75 5 70-82 Borderline  ?Delayed Memory (DMI) 14 67 1 62-77 Extremely Low  ? ? ? ? ? ?Primary Subtest Scaled Score Summary  ?Subtest Domain Raw Score Scaled Score Percentile Rank  ?Logical Memory I AM '18 6 9  '$ ?  Logical Memory II AM '3 4 2  '$ ?Verbal Paired Associates I AM '11 7 16  '$ ?Verbal Paired Associates II AM '4 8 25  '$ ?Visual Reproduction I VM '16 5 5  '$ ?Visual Reproduction II VM 0 2 0.4  ?Symbol Span VWM '2 3 1  '$ ? ? ? ? ? ?Auditory Memory Process Score Summary  ?Process Score Raw Score Scaled Score Percentile Rank Cumulative Percentage ?(Base Rate)  ?LM II Recognition 15 - - 17-25%  ?VPA II Recognition  28 - - 51-75%  ? ? ? ? ? ? ?Visual Memory Process Score Summary  ?Process Score Raw Score Scaled Score Percentile Rank Cumulative Percentage ?(Base Rate)  ?VR II Recognition 3 - - 26-50%  ? ? ? ? ? ? ?ABILITY-MEMORY ANALYSIS ? ?Ability Score:  VCI: 87 ?Date of Testing:  WAIS-IV; WMS-IV 2021/06/07 ? ?Predicted Difference Method   ?Index Predicted ?WMS-IV ?Index Score Actual ?WMS-IV ?Index Score Difference Critical Value ? Significant ?Difference ?Y/N Base Rate  ?Auditory Memory 93 78 15 9.39 Y 10-15%  ?Visual Memory 94 62 32 8.28 Y 1%  ?Immediate Memory 92 75 17 10.49 Y 5-10%  ?Delayed Memory 93 67 26 12.08 Y 2-3%  ?Statistical significance (critical value) at the .01 level.  ? ? ? ? ? ? ? ? ?Feedback to Patient: ?Kiara Ruiz will return on 07/18/2021 for an interactive feedback session with Dr. Sima Matas at which time her test performances, clinical impressions and treatment recommendations will be reviewed in detail. The patient understands she can contact our office should she require our assistance before this time. ? ?150 minutes spent face-to-face with patient administering standardized tests, 30 minutes spent scoring (technician). [CPT Y8200648, 35361] ? ?Full report to follow.  ?

## 2021-06-19 ENCOUNTER — Encounter: Payer: Self-pay | Admitting: Psychology

## 2021-06-19 ENCOUNTER — Encounter (HOSPITAL_BASED_OUTPATIENT_CLINIC_OR_DEPARTMENT_OTHER): Payer: Medicare PPO | Admitting: Psychology

## 2021-06-19 DIAGNOSIS — F0280 Dementia in other diseases classified elsewhere without behavioral disturbance: Secondary | ICD-10-CM

## 2021-06-19 DIAGNOSIS — I6782 Cerebral ischemia: Secondary | ICD-10-CM | POA: Diagnosis not present

## 2021-06-19 DIAGNOSIS — F09 Unspecified mental disorder due to known physiological condition: Secondary | ICD-10-CM | POA: Diagnosis not present

## 2021-06-19 DIAGNOSIS — R4189 Other symptoms and signs involving cognitive functions and awareness: Secondary | ICD-10-CM | POA: Diagnosis not present

## 2021-06-19 NOTE — Progress Notes (Addendum)
Neuropsychological Evaluation ? ? ?Patient:  Kiara Ruiz  ? ?DOB: 1945/06/07 ? ?MR Number: [id number] ? ?Location: [facility] ?[address], [address] [place] ?[id number] ?[place] [address] ?Dept: [phone] ? ?Start: 4 PM ?End: 5 PM ? ?Provider/Observer:     Edgardo Roys PsyD ? ?Chief Complaint:      ?Chief Complaint  ?Patient presents with  ? Memory Loss  ? Other  ?  Confusion and cognitive processing issues  ? ? ?Reason For Service:     Kiara Ruiz is a 76 year old female referred for a neuropsychological evaluation by the patient's primary care provider Lucianne Lei, MD because of ongoing and worsening issues with memory and learning, changes in motor skills and motor functioning and increased confusion.  The patient has been having significant trouble keeping up with things like her checkbook, working on a computer and operating her phone.  There has also been reported change in mood with sudden agitation described by her husband.  There have also been visual hallucinations at time reported by the patient's husband.  The patient tends to minimize any difficulties but does not question her husband's description.  She does admit to noticing some short-term memory issues but also added "I seem normal to me.  "Patient does have a past medical history including breast cancer and is now in remission. ? ?Past medical history includes breast cancer, chronic cystitis, osteopenia and glaucoma.  The patient had a head CT scan without contrast conducted on 12/14/2020 following an episode of altered mental status.  The interpretive results included mild generalized cerebral atrophy along with mild to moderate patchy and ill-defined hypoattenuation within the cerebral white matter, nonspecific but compatible with chronic small vessel ischemic disease.  There was no evidence of acute intercranial abnormality. ? ?The  patient's husband identifies greater issues than the patient herself identifies and she was somewhat reticent in describing any perceived problems.  The patient's husband reports that roughly 2 years ago she had significant pain that sounds like it was similar to psoriatic nerve pain.  The patient had a lot of difficulties during this time but after approximately 6 months the pain improved and dissipated.  He describes the patient as not sleeping well at night and will get very confused particularly at night.  He reports there are times when his wife will wake up and not know who he is and appear quite distressed.  Patient's husband also reports that she is having difficulty keeping up with the checkbook, navigating the computer or phone.  He also reports that there are sudden changes in her mood at times and behavioral changes and will become agitated at times. ? ?The patient reports that overall her sleep is adequate and has improved.  The patient reports that there have been some significant times of not sleeping well but she has been instructed by her physician not to take naps during the day and feels like it is helpful.  Patient's husband describes some significant snoring by her and there is a family history of significant sleep apnea.  Her appetite is described as good. ? ?The patient had a CT head without contrast performed on 12/14/2020 which did show indications of chronic small vessel disease.  There was mild to moderate patchy and ill-defined hypoattenuation within the cerebral white matter noted. ? ?Tests Administered: ?Grooved Pegboard ?Wechsler Adult Intelligence Scale, 4th Edition (WAIS-IV) ?Wechsler Memory Scale, 4th Edition (WMS-IV); Older  Adult Battery  ? ?Participation Level:   Active ? ?Participation Quality:  Appropriate   ?   ?Behavioral Observation:  The patient arrived 30 minutes early to her testing appointment. She appeared well-groomed and well-dressed. Her manners were polite and  appropriate to the situation. The patient was soft-spoken and exhibited a gentle demeanor. She required frequent repetition throughout the test and had significant difficulty understanding instructions. ? ?Well Groomed, Alert, and Appropriate.  ? ?Test Results:   Initially, an estimation was made as to the patient's premorbid intellectual and cognitive functioning to allow for comparisons to current derived estimates of cognitive functioning for comparison purposes.  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  It is estimated that the patient's premorbid intellectual and cognitive functioning to be at least in the high average if not superior range to a normative population and we will utilize an estimate of roughly 120 composite T-scores as an estimate for comparison purposes. ? ?Grooved Pegboard ?Right ?Time = 2:33.22 ?0 drops ?Left ?Time = 3:50.17 ?1 drop ?  ?  ?The patient was initially administered the grooved pegboard test to assess fine motor control.  The patient required significant time to complete the fine motor tasks for both her dominant right hand and nondominant left hand.  She did take considerably more time with her left hand but both were significantly impaired and well below predicted levels based on her occupational history.  There does appear to be significant deficits for fine motor control and while her left nondominant hand was much more significantly impaired they both were impaired and there was not clear evidence of lateralization of her fine motor control deficits. ?  ?  ?  ?WAIS-IV ?  ?          ?Composite Score Summary       ?Scale Sum of ?Scaled Scores Composite ?Score Percentile ?Rank 95% Conf. ?Interval Qualitative Description  ?Verbal  Comprehension 23 VCI 87 19 82-93 Low Average  ?Perceptual Reasoning 10 PRI 60 0.4 56-68 Extremely Low  ?Working Memory 8 WMI 66 1 61-75 Extremely Low  ?Processing Speed 4 PSI 56 0.2 52-69 Extremely Low  ?Full Scale 45 FSIQ 63 1 60-68 Extremely Low  ?General Ability 33 GAI 72 3 68-78 Borderline  ?  ?  ?The patient was administered the Wechsler Adult Intelligence Scale-IV to provide a broad assessment of current intellectual and cognitive functions across multiple cognitive domains.  These current derived score should not be used to describe her predict her premorbid functioning but are rather objective assessments of current cognitive functioning. ? ?The patient produced a full-scale IQ score (global composite score) of 63 which falls at the 1st percentile and is in the extremely low range relative to a normative population.  This global functioning scale suggest that multiple areas of cognitive functioning are being significantly impacted.  We also calculated the patient's general abilities index score which places less emphasis on the most sensitive areas of cognitive functioning to acute changes.  The patient produced a global abilities index score of 72 which falls at the 3rd percentile and is in the borderline relative to a normative population.  While this is somewhat better than her entire composite score it does clearly indicate significant cognitive deficits in areas that are  less heavily reliant on attentional and information processing speed variables. ?  ?  ?        ?Verbal Comprehension Subtests Summary    ?Subtest Raw Score Scaled Score Percentile Rank Reference Group Scaled Score SEM  ?Similarities '17 8 25 6 '$ 1.12  ?Vocabulary 31 9 37 9 0.73  ?Information '6 6 9 6 '$ 0.73  ?(Comprehension) '18 8 25 7 '$ 1.08  ?  ?  ?The patient produced a verbal comprehension index score of 87 which falls at the 19th percentile and is in the low average range.  While this composite score is her highest area of functioning  relative to her own performance it is significantly below predicted levels of premorbid functioning suggesting difficulties in some areas.  The patient performed in the average and lower end of the average range on meas

## 2021-06-28 ENCOUNTER — Ambulatory Visit: Payer: Medicare PPO | Admitting: Psychology

## 2021-07-10 DIAGNOSIS — Z6829 Body mass index (BMI) 29.0-29.9, adult: Secondary | ICD-10-CM | POA: Diagnosis not present

## 2021-07-10 DIAGNOSIS — Z0000 Encounter for general adult medical examination without abnormal findings: Secondary | ICD-10-CM | POA: Diagnosis not present

## 2021-07-10 DIAGNOSIS — I972 Postmastectomy lymphedema syndrome: Secondary | ICD-10-CM | POA: Diagnosis not present

## 2021-07-10 DIAGNOSIS — E559 Vitamin D deficiency, unspecified: Secondary | ICD-10-CM | POA: Diagnosis not present

## 2021-07-10 DIAGNOSIS — F0280 Dementia in other diseases classified elsewhere without behavioral disturbance: Secondary | ICD-10-CM | POA: Diagnosis not present

## 2021-07-10 DIAGNOSIS — E782 Mixed hyperlipidemia: Secondary | ICD-10-CM | POA: Diagnosis not present

## 2021-07-10 DIAGNOSIS — E1169 Type 2 diabetes mellitus with other specified complication: Secondary | ICD-10-CM | POA: Diagnosis not present

## 2021-07-10 DIAGNOSIS — I6782 Cerebral ischemia: Secondary | ICD-10-CM | POA: Diagnosis not present

## 2021-07-10 DIAGNOSIS — D519 Vitamin B12 deficiency anemia, unspecified: Secondary | ICD-10-CM | POA: Diagnosis not present

## 2021-07-10 DIAGNOSIS — F09 Unspecified mental disorder due to known physiological condition: Secondary | ICD-10-CM | POA: Diagnosis not present

## 2021-07-11 ENCOUNTER — Other Ambulatory Visit: Payer: Self-pay | Admitting: Family Medicine

## 2021-07-11 DIAGNOSIS — F0390 Unspecified dementia without behavioral disturbance: Secondary | ICD-10-CM

## 2021-07-11 DIAGNOSIS — R413 Other amnesia: Secondary | ICD-10-CM

## 2021-07-18 ENCOUNTER — Encounter: Payer: Medicare PPO | Attending: Psychology | Admitting: Psychology

## 2021-07-18 ENCOUNTER — Encounter: Payer: Self-pay | Admitting: Psychology

## 2021-07-18 DIAGNOSIS — F0280 Dementia in other diseases classified elsewhere without behavioral disturbance: Secondary | ICD-10-CM | POA: Insufficient documentation

## 2021-07-18 DIAGNOSIS — I6782 Cerebral ischemia: Secondary | ICD-10-CM | POA: Insufficient documentation

## 2021-07-18 NOTE — Progress Notes (Signed)
07/18/2021 2 PM-3 PM: Today's visit was an in person visit that was conducted in outpatient clinic office with the patient, her husband and myself all present.  We reviewed the results of the recent neuropsychological evaluation with specific recommendations going forward.  The results are consistent with cognitive strengths and weaknesses related to microvascular ischemic changes in white matter regions as a primary culprit although potential obstructive sleep apnea may also be exacerbating the situation.  I have made recommendations in her formal report to consider having the patient go ahead and complete the MRI scan which Dr. Criss Rosales had previously ordered as well as blood work looking particularly at issues related to particular B vitamins including thiamine, niacin and B12.  I also think the patient should have a sleep study at least a home sleep study if not a formal sleep study.  I have included a copy of the summary from the formal neuropsychological evaluation below for convenience and the complete evaluation can be found in the patient's EMR dated 06/19/2021. ? ? ? ? ? ?Impression/Diagnosis:                     The patient clearly meets the diagnostic criterion for a dementia with significant loss in multiple areas of cognitive functioning.  Essentially, all cognitive areas assessed were significantly impaired and roughly 2-3 standard deviations below predicted levels of premorbid functioning.  There were significant visual-spatial deficits, deficits in executive functioning and reasoning/problem-solving abilities, reduced information processing speed, and deficits with memory and learning.  However, their assessment of memory and learning suggest that the primary elements have to do with impaired encoding capacity and working memory as well as significant retrieval deficits with less significant impairments with regard to storage and organizational patterns.  As far as diagnostic criteria and, this pattern  does not look particularly consistent with progressive dementia such as Alzheimer's type dementia and while the patient does have significant developing motor deficits both objectively assessed and found as well as subjective reports is not particularly consistent with tremors but more changes in fine motor control and manual dexterity.  While the patient has not had an MRI she did have a CT scan conducted in 2022 that suggest microvascular ischemic changes and small vessel disease. Given the pattern of cognitive strengths and weaknesses, the most likely culprit for her significant and apparently progressing cognitive deficits would be due to microvascular ischemic changes.  The patient is also displaying some personality changes and becoming confused and agitated particularly at night.  There have been prolonged periods of poor sleep although the patient reports that she is sleeping better now her husband reports that there continues to be sleep disturbance and the patient will wake up at night confused and have difficulty orienting. ? ?The likely culprit for her progressing cognitive changes and cognitive loss are likely cerebrovascular in nature rather than progressive dementia such as Alzheimer's.  There are features consistent with Lewy body dementia but also some significant differences particularly differences between her primary memory deficits and retrieval deficits are more consistent with significant changes in white matter brain regions.  It may be helpful to have an MRI done to get a better assessment for potential microvascular ischemic changes.  Another issue that could be playing a role is a possibility of an obstructive sleep apnea with some of the confusion and difficulty waking up related to significant sleep disturbance.  This would also explain some of the memory and cognitive difficulties and therefore should  be ruled out as a contributing factor. ? ?I would suggest that the patient could have  more clarity as far as diagnostic considerations with an MRI and it would also be pertinent to look at lab work particularly around issues such as B12, thiamine and niacin studies to make sure were not missing a metabolic issue.  Another recommendation is the patient having a formal sleep study to assess possibility of an obstructive sleep apnea.  I will sit down with the patient and her husband and provide feedback regarding the results of the current neuropsychological evaluation and work on treatment goals and recommendations. ?  ?Diagnosis:                              ?  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  ?Subcortical microvascular ischemic occlusive disease ?  ?Cognitive and neurobehavioral dysfunction ?  ?  ?_____________________ ?Ilean Skill, Psy.D. ?Clinical Neuropsychologist ?

## 2021-07-26 ENCOUNTER — Telehealth: Payer: Self-pay | Admitting: Hematology and Oncology

## 2021-07-26 NOTE — Telephone Encounter (Signed)
Rescheduled appointment per provider PAL. Called both numbers on file. Unable to leave a voicemail due to mailbox being full. Patient will be mailed an updated calendar.

## 2021-08-04 DIAGNOSIS — K5900 Constipation, unspecified: Secondary | ICD-10-CM | POA: Diagnosis not present

## 2021-08-06 DIAGNOSIS — Z170 Estrogen receptor positive status [ER+]: Secondary | ICD-10-CM | POA: Diagnosis not present

## 2021-08-06 DIAGNOSIS — C50412 Malignant neoplasm of upper-outer quadrant of left female breast: Secondary | ICD-10-CM | POA: Diagnosis not present

## 2021-08-08 ENCOUNTER — Inpatient Hospital Stay: Payer: Medicare PPO | Admitting: Hematology and Oncology

## 2021-08-16 DIAGNOSIS — K5900 Constipation, unspecified: Secondary | ICD-10-CM | POA: Diagnosis not present

## 2021-08-16 DIAGNOSIS — E1169 Type 2 diabetes mellitus with other specified complication: Secondary | ICD-10-CM | POA: Diagnosis not present

## 2021-08-16 DIAGNOSIS — E559 Vitamin D deficiency, unspecified: Secondary | ICD-10-CM | POA: Diagnosis not present

## 2021-08-16 DIAGNOSIS — M130 Polyarthritis, unspecified: Secondary | ICD-10-CM | POA: Diagnosis not present

## 2021-08-16 DIAGNOSIS — E782 Mixed hyperlipidemia: Secondary | ICD-10-CM | POA: Diagnosis not present

## 2021-08-24 DIAGNOSIS — H04123 Dry eye syndrome of bilateral lacrimal glands: Secondary | ICD-10-CM | POA: Diagnosis not present

## 2021-08-24 DIAGNOSIS — H401132 Primary open-angle glaucoma, bilateral, moderate stage: Secondary | ICD-10-CM | POA: Diagnosis not present

## 2021-08-24 DIAGNOSIS — Z961 Presence of intraocular lens: Secondary | ICD-10-CM | POA: Diagnosis not present

## 2021-08-24 DIAGNOSIS — H35373 Puckering of macula, bilateral: Secondary | ICD-10-CM | POA: Diagnosis not present

## 2021-08-27 NOTE — Progress Notes (Signed)
Patient Care Team: Lucianne Lei, MD as PCP - General (Family Medicine)  DIAGNOSIS:  Encounter Diagnosis  Name Primary?   Malignant neoplasm of lower-outer quadrant of left breast of female, estrogen receptor positive ([place])     SUMMARY OF ONCOLOGIC HISTORY: Oncology History  Breast cancer of lower-outer quadrant of left female breast ([place])  06/29/2015 Initial Diagnosis   Left breast biopsy LOQ: invasive lobular carcinoma, grade 1, ER 80%, PR 0%, Ki-67 15%, HER-2 negative ratio 1.24; screening detected distortion without ultrasound correlate    07/10/2015 Breast MRI   Left breast posterior lower outer quadrant: Irregular mass and NME 4.3 x 2.3 x 2.2 cm, no lymph nodes, T2 N0 stage II a clinical stage   08/08/2015 Surgery   Left lumpectomy ([place]): Invasive lobular carcinoma, grade 2, 3.1 cm, broad-based was in the posterior, and inferior margin, LCIS, perineural invasion present, no LV I, ER 80%, PR 0%, HER-2 negative, Ki-67 15%, T2 N1 a stage IIB   08/08/2015 Miscellaneous   Mammaprint: Low risk, luminal type a, 10 year risk of recurrence 10%   09/21/2015 Surgery   Left mastectomy: Invasive lobular cancer, grade 2, ALH, PNI present,    10/22/2015 -  Anti-estrogen oral therapy   Anastrozole 1 mg daily      CHIEF COMPLIANT:  Follow-up of left breast cancer on anastrozole   INTERVAL HISTORY: Kiara Ruiz is a 76 y.o. with above-mentioned history of left breast cancer treated with initial lumpectomy followed by mastectomy. She is currently on oral antiestrogen therapy with anastrozole.  She presents to the clinic today for annual follow-up. Denies hot flashes and joint stiffness. States she has some discomfort in the breast. She walks everyday some days its twice a day.     ALLERGIES:  has No Known Allergies.  MEDICATIONS:  Current Outpatient Medications  Medication Sig Dispense Refill   anastrozole (ARIMIDEX) 1 MG tablet Take 1 tablet (1 mg total) by mouth daily. 90 tablet 3    CALCIUM-VITAMIN D PO Take 1 tablet by mouth daily.      latanoprost (XALATAN) 0.005 % ophthalmic solution Place 1 drop into both eyes at bedtime.      Multiple Vitamin (MULTIVITAMIN PO) Take 1 tablet by mouth daily.      timolol (BETIMOL) 0.5 % ophthalmic solution Place 1 drop into both eyes every morning.      No current facility-administered medications for this visit.    PHYSICAL EXAMINATION: ECOG PERFORMANCE STATUS: 1 - Symptomatic but completely ambulatory  Vitals:   09/10/21 1512  BP: 121/74  Pulse: 81  Resp: 18  Temp: 97.8 F (36.6 C)  SpO2: 99%   Filed Weights   09/10/21 1512  Weight: 138 lb (62.6 kg)    BREAST: No palpable masses or nodules in either right   breast.  Left chest wall is without any lumps or nodules status post mastectomy.  No palpable axillary supraclavicular or infraclavicular adenopathy no breast tenderness or nipple discharge. (exam performed in the presence of a chaperone)  LABORATORY DATA:  I have reviewed the data as listed    Latest Ref Rng & Units 09/21/2015    6:45 PM 07/12/2015    8:26 AM  CMP  Glucose 70 - 140 mg/dl  82   BUN 7.0 - 26.0 mg/dL  11.2   Creatinine 0.44 - 1.00 mg/dL 0.86  0.9   Sodium 136 - 145 mEq/L  142   Potassium 3.5 - 5.1 mEq/L  4.4   CO2 22 -  29 mEq/L  28   Calcium 8.4 - 10.4 mg/dL  9.7   Total Protein 6.4 - 8.3 g/dL  7.5   Total Bilirubin 0.20 - 1.20 mg/dL  0.50   Alkaline Phos 40 - 150 U/L  73   AST 5 - 34 U/L  15   ALT 0 - 55 U/L  13     Lab Results  Component Value Date   WBC 9.0 09/22/2015   HGB 11.2 (L) 09/22/2015   HCT 35.7 (L) 09/22/2015   MCV 95.5 09/22/2015   PLT 193 09/22/2015   NEUTROABS 3.7 07/12/2015    ASSESSMENT & PLAN:  Breast cancer of lower-outer quadrant of left female breast ([place]) Left lumpectomy 08/08/2015: Invasive lobular carcinoma, grade 2, 3.1 cm, broad-based was in the posterior, and inferior margin, LCIS, perineural invasion present, no LV I, ER 80%, PR 0%, HER-2 negative,  Ki-67 15%, T2 N1 a stage IIB Left mastectomy 09/21/2015: Residual invasive lobular cancer grade 2, perineural invasion, ALH Mammaprint: Low risk, luminal A, 10% risk of recurrence in 10 years   Current Treatment: Adjuvant antiestrogen therapy with anastrozole 1 mg by mouth daily for 10 years started 10/22/15 Anastrozole Toxicities:. Patient denies any hot flashes or myalgias.    Surveillance: Mammograms: 12/07/2020 right breast: Benign, breast density category C Breast exam 09/10/2021: Benign  Return to clinic in 1 year for follow-up    No orders of the defined types were placed in this encounter.  The patient has a good understanding of the overall plan. she agrees with it. she will call with any problems that may develop before the next visit here. Total time spent: 30 mins including face to face time and time spent for planning, charting and co-ordination of care   Harriette Ohara, MD 09/10/21  I Gardiner Coins am scribing for Dr. Lindi Adie  I have reviewed the above documentation for accuracy and completeness, and I agree with the above.

## 2021-09-10 ENCOUNTER — Other Ambulatory Visit: Payer: Self-pay

## 2021-09-10 ENCOUNTER — Inpatient Hospital Stay: Payer: Medicare PPO | Attending: Hematology and Oncology | Admitting: Hematology and Oncology

## 2021-09-10 DIAGNOSIS — C50512 Malignant neoplasm of lower-outer quadrant of left female breast: Secondary | ICD-10-CM | POA: Insufficient documentation

## 2021-09-10 DIAGNOSIS — Z923 Personal history of irradiation: Secondary | ICD-10-CM | POA: Insufficient documentation

## 2021-09-10 DIAGNOSIS — Z79899 Other long term (current) drug therapy: Secondary | ICD-10-CM | POA: Diagnosis not present

## 2021-09-10 DIAGNOSIS — Z79811 Long term (current) use of aromatase inhibitors: Secondary | ICD-10-CM | POA: Insufficient documentation

## 2021-09-10 DIAGNOSIS — Z170 Estrogen receptor positive status [ER+]: Secondary | ICD-10-CM | POA: Insufficient documentation

## 2021-09-10 MED ORDER — ANASTROZOLE 1 MG PO TABS: 1.0000 mg | ORAL_TABLET | Freq: Every day | ORAL | 3 refills | Status: DC

## 2021-09-10 NOTE — Assessment & Plan Note (Addendum)
Left lumpectomy 08/08/2015: Invasive lobular carcinoma, grade 2, 3.1 cm, broad-based was in the posterior, and inferior margin, LCIS, perineural invasion present, no LV I, ER 80%, PR 0%, HER-2 negative, Ki-67 15%, T2 N1 a stage IIB Left mastectomy 09/21/2015: Residual invasive lobular cancer grade 2, perineural invasion, ALH Mammaprint: Low risk, luminal A, 10% risk of recurrence in 10 years  Current Treatment: Adjuvant antiestrogen therapy with anastrozole 1 mg by mouth daily for 10 years started 10/22/15 Anastrozole Toxicities:. Patient denies any hot flashes or myalgias.  Surveillance: Mammograms: 12/07/2020 right breast: Benign, breast density category C Breast exam 09/10/2021: Benign  Return to clinic in 1 year for follow-up

## 2021-10-10 DIAGNOSIS — F0280 Dementia in other diseases classified elsewhere without behavioral disturbance: Secondary | ICD-10-CM | POA: Diagnosis not present

## 2021-10-10 DIAGNOSIS — E785 Hyperlipidemia, unspecified: Secondary | ICD-10-CM | POA: Diagnosis not present

## 2021-10-10 DIAGNOSIS — I10 Essential (primary) hypertension: Secondary | ICD-10-CM | POA: Diagnosis not present

## 2021-10-10 DIAGNOSIS — E782 Mixed hyperlipidemia: Secondary | ICD-10-CM | POA: Diagnosis not present

## 2021-10-10 DIAGNOSIS — K5909 Other constipation: Secondary | ICD-10-CM | POA: Diagnosis not present

## 2021-10-30 ENCOUNTER — Other Ambulatory Visit: Payer: Self-pay | Admitting: Family Medicine

## 2021-10-30 DIAGNOSIS — Z1231 Encounter for screening mammogram for malignant neoplasm of breast: Secondary | ICD-10-CM

## 2021-11-15 DIAGNOSIS — C50912 Malignant neoplasm of unspecified site of left female breast: Secondary | ICD-10-CM | POA: Diagnosis not present

## 2021-12-03 ENCOUNTER — Ambulatory Visit: Payer: Medicare PPO

## 2021-12-05 DIAGNOSIS — C50912 Malignant neoplasm of unspecified site of left female breast: Secondary | ICD-10-CM | POA: Diagnosis not present

## 2021-12-13 DIAGNOSIS — R413 Other amnesia: Secondary | ICD-10-CM | POA: Diagnosis not present

## 2021-12-13 DIAGNOSIS — R7309 Other abnormal glucose: Secondary | ICD-10-CM | POA: Diagnosis not present

## 2021-12-13 DIAGNOSIS — F411 Generalized anxiety disorder: Secondary | ICD-10-CM | POA: Diagnosis not present

## 2021-12-13 DIAGNOSIS — Z79899 Other long term (current) drug therapy: Secondary | ICD-10-CM | POA: Diagnosis not present

## 2021-12-13 DIAGNOSIS — R739 Hyperglycemia, unspecified: Secondary | ICD-10-CM | POA: Diagnosis not present

## 2021-12-21 ENCOUNTER — Ambulatory Visit
Admission: RE | Admit: 2021-12-21 | Discharge: 2021-12-21 | Disposition: A | Discharge status: Home / Self Care | Payer: Medicare PPO | Source: Ambulatory Visit | Attending: Family Medicine | Admitting: Family Medicine

## 2021-12-21 DIAGNOSIS — Z1231 Encounter for screening mammogram for malignant neoplasm of breast: Secondary | ICD-10-CM | POA: Diagnosis not present

## 2021-12-28 DIAGNOSIS — R739 Hyperglycemia, unspecified: Secondary | ICD-10-CM | POA: Diagnosis not present

## 2022-02-13 DIAGNOSIS — E1169 Type 2 diabetes mellitus with other specified complication: Secondary | ICD-10-CM | POA: Diagnosis not present

## 2022-02-13 DIAGNOSIS — M130 Polyarthritis, unspecified: Secondary | ICD-10-CM | POA: Diagnosis not present

## 2022-02-13 DIAGNOSIS — Z79811 Long term (current) use of aromatase inhibitors: Secondary | ICD-10-CM | POA: Diagnosis not present

## 2022-02-13 DIAGNOSIS — Z6824 Body mass index (BMI) 24.0-24.9, adult: Secondary | ICD-10-CM | POA: Diagnosis not present

## 2022-02-13 DIAGNOSIS — I10 Essential (primary) hypertension: Secondary | ICD-10-CM | POA: Diagnosis not present

## 2022-02-13 DIAGNOSIS — E782 Mixed hyperlipidemia: Secondary | ICD-10-CM | POA: Diagnosis not present

## 2022-02-15 DIAGNOSIS — H04123 Dry eye syndrome of bilateral lacrimal glands: Secondary | ICD-10-CM | POA: Diagnosis not present

## 2022-02-15 DIAGNOSIS — H35373 Puckering of macula, bilateral: Secondary | ICD-10-CM | POA: Diagnosis not present

## 2022-02-15 DIAGNOSIS — H401132 Primary open-angle glaucoma, bilateral, moderate stage: Secondary | ICD-10-CM | POA: Diagnosis not present

## 2022-02-15 DIAGNOSIS — Z961 Presence of intraocular lens: Secondary | ICD-10-CM | POA: Diagnosis not present

## 2022-02-20 ENCOUNTER — Other Ambulatory Visit: Payer: Self-pay | Admitting: Family Medicine

## 2022-02-20 ENCOUNTER — Ambulatory Visit
Admission: RE | Admit: 2022-02-20 | Discharge: 2022-02-20 | Disposition: A | Discharge status: Home / Self Care | Payer: Medicare PPO | Source: Ambulatory Visit | Attending: Family Medicine | Admitting: Family Medicine

## 2022-02-20 DIAGNOSIS — M130 Polyarthritis, unspecified: Secondary | ICD-10-CM

## 2022-02-20 DIAGNOSIS — M25511 Pain in right shoulder: Secondary | ICD-10-CM | POA: Diagnosis not present

## 2022-03-14 ENCOUNTER — Inpatient Hospital Stay: Payer: Medicare PPO | Attending: Hematology and Oncology | Admitting: Hematology and Oncology

## 2022-03-14 VITALS — BP 143/76 | HR 85 | Temp 97.8°F | Wt 136.1 lb

## 2022-03-14 DIAGNOSIS — C50512 Malignant neoplasm of lower-outer quadrant of left female breast: Secondary | ICD-10-CM | POA: Insufficient documentation

## 2022-03-14 DIAGNOSIS — Z9012 Acquired absence of left breast and nipple: Secondary | ICD-10-CM | POA: Insufficient documentation

## 2022-03-14 DIAGNOSIS — Z79811 Long term (current) use of aromatase inhibitors: Secondary | ICD-10-CM | POA: Diagnosis not present

## 2022-03-14 DIAGNOSIS — M25512 Pain in left shoulder: Secondary | ICD-10-CM | POA: Insufficient documentation

## 2022-03-14 DIAGNOSIS — Z170 Estrogen receptor positive status [ER+]: Secondary | ICD-10-CM | POA: Insufficient documentation

## 2022-03-14 NOTE — Assessment & Plan Note (Signed)
Left lumpectomy 08/08/2015: Invasive lobular carcinoma, grade 2, 3.1 cm, broad-based was in the posterior, and inferior margin, LCIS, perineural invasion present, no LV I, ER 80%, PR 0%, HER-2 negative, Ki-67 15%, T2 N1 a stage IIB Left mastectomy 09/21/2015: Residual invasive lobular cancer grade 2, perineural invasion, ALH Mammaprint: Low risk, luminal A, 10% risk of recurrence in 10 years   Current Treatment: Adjuvant antiestrogen therapy with anastrozole 1 mg by mouth daily for 10 years started 10/22/15 Anastrozole Toxicities:. Patient denies any hot flashes or myalgias.    Surveillance: Mammograms: 12/25/2021 right breast: Benign, breast density category D Breast exam 03/14/2022: Benign   Return to clinic in 1 year for follow-up

## 2022-03-14 NOTE — Progress Notes (Signed)
Patient Care Team: Lucianne Lei, MD as PCP - General (Family Medicine) Nicholas Lose, MD as Consulting Physician (Hematology and Oncology)  DIAGNOSIS:  Encounter Diagnosis  Name Primary?   Malignant neoplasm of lower-outer quadrant of left breast of female, estrogen receptor positive ([place]) Yes    SUMMARY OF ONCOLOGIC HISTORY: Oncology History  Breast cancer of lower-outer quadrant of left female breast ([place])  06/29/2015 Initial Diagnosis   Left breast biopsy LOQ: invasive lobular carcinoma, grade 1, ER 80%, PR 0%, Ki-67 15%, HER-2 negative ratio 1.24; screening detected distortion without ultrasound correlate    07/10/2015 Breast MRI   Left breast posterior lower outer quadrant: Irregular mass and NME 4.3 x 2.3 x 2.2 cm, no lymph nodes, T2 N0 stage II a clinical stage   08/08/2015 Surgery   Left lumpectomy ([facility]): Invasive lobular carcinoma, grade 2, 3.1 cm, broad-based was in the posterior, and inferior margin, LCIS, perineural invasion present, no LV I, ER 80%, PR 0%, HER-2 negative, Ki-67 15%, T2 N1 a stage IIB   08/08/2015 Miscellaneous   Mammaprint: Low risk, luminal type a, 10 year risk of recurrence 10%   09/21/2015 Surgery   Left mastectomy: Invasive lobular cancer, grade 2, ALH, PNI present,    10/22/2015 -  Anti-estrogen oral therapy   Anastrozole 1 mg daily     CHIEF COMPLIANT: Anastrozole follow-up with severe right shoulder pain  INTERVAL HISTORY: BARI LEIB is a 24 M with above-mentioned history of  breast cancer who is currently on anastrozole therapy since 2017.  She will finish 7 years of therapy by August 2024.  She came in earlier because of 18-monthhistory of right shoulder pain.  She had an x-Petitjean of the shoulder which showed arthritis.  The pain she rates it at 8 out of 10 and is aggravated by lifting the arm.   ALLERGIES:  has No Known Allergies.  MEDICATIONS:  Current Outpatient Medications  Medication Sig Dispense Refill   anastrozole (ARIMIDEX) 1  MG tablet Take 1 tablet (1 mg total) by mouth daily. 90 tablet 3   CALCIUM-VITAMIN D PO Take 1 tablet by mouth daily.      latanoprost (XALATAN) 0.005 % ophthalmic solution Place 1 drop into both eyes at bedtime.      Multiple Vitamin (MULTIVITAMIN PO) Take 1 tablet by mouth daily.      timolol (BETIMOL) 0.5 % ophthalmic solution Place 1 drop into both eyes every morning.      No current facility-administered medications for this visit.    PHYSICAL EXAMINATION: ECOG PERFORMANCE STATUS: 1 - Symptomatic but completely ambulatory  Vitals:   03/14/22 0941  BP: (!) 143/76  Pulse: 85  Temp: 97.8 F (36.6 C)  SpO2: 97%   Filed Weights   03/14/22 0941  Weight: 136 lb 1.6 oz (61.7 kg)     LABORATORY DATA:  I have reviewed the data as listed    Latest Ref Rng & Units 09/21/2015    6:45 PM 07/12/2015    8:26 AM  CMP  Glucose 70 - 140 mg/dl  82   BUN 7.0 - 26.0 mg/dL  11.2   Creatinine 0.44 - 1.00 mg/dL 0.86  0.9   Sodium 136 - 145 mEq/L  142   Potassium 3.5 - 5.1 mEq/L  4.4   CO2 22 - 29 mEq/L  28   Calcium 8.4 - 10.4 mg/dL  9.7   Total Protein 6.4 - 8.3 g/dL  7.5   Total Bilirubin 0.20 - 1.20 mg/dL  0.50   Alkaline Phos 40 - 150 U/L  73   AST 5 - 34 U/L  15   ALT 0 - 55 U/L  13     Lab Results  Component Value Date   WBC 9.0 09/22/2015   HGB 11.2 (L) 09/22/2015   HCT 35.7 (L) 09/22/2015   MCV 95.5 09/22/2015   PLT 193 09/22/2015   NEUTROABS 3.7 07/12/2015    ASSESSMENT & PLAN:  Breast cancer of lower-outer quadrant of left female breast ([place]) Left lumpectomy 08/08/2015: Invasive lobular carcinoma, grade 2, 3.1 cm, broad-based was in the posterior, and inferior margin, LCIS, perineural invasion present, no LV I, ER 80%, PR 0%, HER-2 negative, Ki-67 15%, T2 N1 a stage IIB Left mastectomy 09/21/2015: Residual invasive lobular cancer grade 2, perineural invasion, ALH Mammaprint: Low risk, luminal A, 10% risk of recurrence in 10 years   Current Treatment: Adjuvant  antiestrogen therapy with anastrozole 1 mg by mouth daily for 10 years started 10/22/15 Anastrozole Toxicities:. Patient denies any hot flashes or myalgias.    Surveillance: Mammograms: 12/25/2021 right breast: Benign, breast density category D Breast exam 03/14/2022: Benign   Severe left shoulder pain 8 out of 10 for the past 3 months.  I would like to obtain a CT chest abdomen pelvis with contrast for further assessment of her disease because she was very high risk to begin with.  Telephone visit 2 days after the to discuss results.   Orders Placed This Encounter  Procedures   CT CHEST ABDOMEN PELVIS W CONTRAST    Standing Status:   Future    Standing Expiration Date:   03/15/2023    Order Specific Question:   If indicated for the ordered procedure, I authorize the administration of contrast media per Radiology protocol    Answer:   Yes    Order Specific Question:   Does the patient have a contrast media/X-Dollens dye allergy?    Answer:   No    Order Specific Question:   Preferred imaging location?    Answer:   [facility]    Order Specific Question:   Release to patient    Answer:   Immediate    Order Specific Question:   Is Oral Contrast requested for this exam?    Answer:   Yes, Per Radiology protocol   The patient has a good understanding of the overall plan. she agrees with it. she will call with any problems that may develop before the next visit here. Total time spent: 30 mins including face to face time and time spent for planning, charting and co-ordination of care   Harriette Ohara, MD 03/14/22

## 2022-03-19 ENCOUNTER — Telehealth: Payer: Self-pay | Admitting: Hematology and Oncology

## 2022-03-19 NOTE — Telephone Encounter (Signed)
Scheduled appointment per 1/11 los. Patient is aware.

## 2022-03-29 ENCOUNTER — Ambulatory Visit (HOSPITAL_COMMUNITY)
Admission: RE | Admit: 2022-03-29 | Discharge: 2022-03-29 | Disposition: A | Discharge status: Home / Self Care | Payer: Medicare PPO | Source: Ambulatory Visit | Attending: Hematology and Oncology | Admitting: Hematology and Oncology

## 2022-03-29 ENCOUNTER — Encounter (HOSPITAL_COMMUNITY): Payer: Self-pay

## 2022-03-29 DIAGNOSIS — Z170 Estrogen receptor positive status [ER+]: Secondary | ICD-10-CM | POA: Insufficient documentation

## 2022-03-29 DIAGNOSIS — I700 Atherosclerosis of aorta: Secondary | ICD-10-CM | POA: Diagnosis not present

## 2022-03-29 DIAGNOSIS — C50912 Malignant neoplasm of unspecified site of left female breast: Secondary | ICD-10-CM | POA: Diagnosis not present

## 2022-03-29 DIAGNOSIS — Z9012 Acquired absence of left breast and nipple: Secondary | ICD-10-CM | POA: Diagnosis not present

## 2022-03-29 DIAGNOSIS — C50512 Malignant neoplasm of lower-outer quadrant of left female breast: Secondary | ICD-10-CM | POA: Diagnosis not present

## 2022-03-29 MED ORDER — SODIUM CHLORIDE (PF) 0.9 % IJ SOLN
INTRAMUSCULAR | Status: AC
  Filled 2022-03-29: qty 50

## 2022-03-29 MED ORDER — IOHEXOL 300 MG/ML  SOLN
100.0000 mL | Freq: Once | INTRAMUSCULAR | Status: AC | PRN
  Administered 2022-03-29: 100 mL via INTRAVENOUS

## 2022-04-01 NOTE — Assessment & Plan Note (Signed)
Left lumpectomy 08/08/2015: Invasive lobular carcinoma, grade 2, 3.1 cm, broad-based was in the posterior, and inferior margin, LCIS, perineural invasion present, no LV I, ER 80%, PR 0%, HER-2 negative, Ki-67 15%, T2 N1 a stage IIB Left mastectomy 09/21/2015: Residual invasive lobular cancer grade 2, perineural invasion, ALH Mammaprint: Low risk, luminal A, 10% risk of recurrence in 10 years   Current Treatment: Adjuvant antiestrogen therapy with anastrozole 1 mg by mouth daily for 10 years started 10/22/15 Anastrozole Toxicities:. Patient denies any hot flashes or myalgias.    Surveillance: Mammograms: 12/25/2021 right breast: Benign, breast density category D Breast exam 03/14/2022: Benign   Severe left shoulder pain 8 out of 10 for the past 3 months.   CT CAP: 03/29/22 No evidence of met disease

## 2022-04-01 NOTE — Progress Notes (Unsigned)
HEMATOLOGY-ONCOLOGY TELEPHONE VISIT PROGRESS NOTE  I connected with our patient on 04/02/22 at  9:00 AM EST by telephone and verified that I am speaking with the correct person using two identifiers.  I discussed the limitations, risks, security and privacy concerns of performing an evaluation and management service by telephone and the availability of in person appointments.  I also discussed with the patient that there may be a patient responsible charge related to this service. The patient expressed understanding and agreed to proceed.   History of Present Illness:  Kiara Ruiz is a 14 with a  history of breast cancer who is currently on anastrozole therapy since 2017. She had a ct chest abdomen pelvis with contrast for further assessment of her disease because she was very high risk to begin with. She presents to the clinic for a telephone visit to discuss the results.  Oncology History  Breast cancer of lower-outer quadrant of left female breast ([place])  06/29/2015 Initial Diagnosis   Left breast biopsy LOQ: invasive lobular carcinoma, grade 1, ER 80%, PR 0%, Ki-67 15%, HER-2 negative ratio 1.24; screening detected distortion without ultrasound correlate    07/10/2015 Breast MRI   Left breast posterior lower outer quadrant: Irregular mass and NME 4.3 x 2.3 x 2.2 cm, no lymph nodes, T2 N0 stage II a clinical stage   08/08/2015 Surgery   Left lumpectomy ([facility]): Invasive lobular carcinoma, grade 2, 3.1 cm, broad-based was in the posterior, and inferior margin, LCIS, perineural invasion present, no LV I, ER 80%, PR 0%, HER-2 negative, Ki-67 15%, T2 N1 a stage IIB   08/08/2015 Miscellaneous   Mammaprint: Low risk, luminal type a, 10 year risk of recurrence 10%   09/21/2015 Surgery   Left mastectomy: Invasive lobular cancer, grade 2, ALH, PNI present,    10/22/2015 -  Anti-estrogen oral therapy   Anastrozole 1 mg daily     REVIEW OF SYSTEMS:   Constitutional: Denies fevers, chills or  abnormal weight loss All other systems were reviewed with the patient and are negative. Observations/Objective:     Assessment Plan:  Breast cancer of lower-outer quadrant of left female breast ([place]) Left lumpectomy 08/08/2015: Invasive lobular carcinoma, grade 2, 3.1 cm, broad-based was in the posterior, and inferior margin, LCIS, perineural invasion present, no LV I, ER 80%, PR 0%, HER-2 negative, Ki-67 15%, T2 N1 a stage IIB Left mastectomy 09/21/2015: Residual invasive lobular cancer grade 2, perineural invasion, ALH Mammaprint: Low risk, luminal A, 10% risk of recurrence in 10 years   Current Treatment: Adjuvant antiestrogen therapy with anastrozole 1 mg by mouth daily for 10 years started 10/22/15 Anastrozole Toxicities:. Patient denies any hot flashes or myalgias.    Surveillance: Mammograms: 12/25/2021 right breast: Benign, breast density category D Breast exam 03/14/2022: Benign   Severe left shoulder Pain subsided now.   CT CAP: 03/29/22 No evidence of met disease Intermittent pain: She will exercise and do stretching.   Patient has an appointment in July 2024 for follow-up.  I discussed the assessment and treatment plan with the patient. The patient was provided an opportunity to ask questions and all were answered. The patient agreed with the plan and demonstrated an understanding of the instructions. The patient was advised to call back or seek an in-person evaluation if the symptoms worsen or if the condition fails to improve as anticipated.   I provided 15 minutes of non-face-to-face time during this encounter.  This includes time for charting and coordination of care  Harriette Ohara, MD  I Gardiner Coins am acting as a Education administrator for [employer]  I have reviewed the above documentation for accuracy and completeness, and I agree with the above.

## 2022-04-02 ENCOUNTER — Inpatient Hospital Stay (HOSPITAL_BASED_OUTPATIENT_CLINIC_OR_DEPARTMENT_OTHER): Payer: Medicare PPO | Admitting: Hematology and Oncology

## 2022-04-02 DIAGNOSIS — C50512 Malignant neoplasm of lower-outer quadrant of left female breast: Secondary | ICD-10-CM

## 2022-04-02 DIAGNOSIS — Z170 Estrogen receptor positive status [ER+]: Secondary | ICD-10-CM

## 2022-04-22 ENCOUNTER — Encounter: Payer: Medicare PPO | Attending: Psychology | Admitting: Psychology

## 2022-05-21 DIAGNOSIS — H35373 Puckering of macula, bilateral: Secondary | ICD-10-CM | POA: Diagnosis not present

## 2022-05-21 DIAGNOSIS — H401132 Primary open-angle glaucoma, bilateral, moderate stage: Secondary | ICD-10-CM | POA: Diagnosis not present

## 2022-06-18 DIAGNOSIS — M130 Polyarthritis, unspecified: Secondary | ICD-10-CM | POA: Diagnosis not present

## 2022-06-18 DIAGNOSIS — E785 Hyperlipidemia, unspecified: Secondary | ICD-10-CM | POA: Diagnosis not present

## 2022-06-18 DIAGNOSIS — I10 Essential (primary) hypertension: Secondary | ICD-10-CM | POA: Diagnosis not present

## 2022-06-18 DIAGNOSIS — Z79811 Long term (current) use of aromatase inhibitors: Secondary | ICD-10-CM | POA: Diagnosis not present

## 2022-09-04 ENCOUNTER — Telehealth: Payer: Self-pay | Admitting: Hematology and Oncology

## 2022-09-04 NOTE — Telephone Encounter (Signed)
Rescheduled appointment per [facility]. Patient is aware of the changes made to her upcoming appointment.

## 2022-09-11 ENCOUNTER — Inpatient Hospital Stay: Payer: Medicare PPO | Admitting: Hematology and Oncology

## 2022-09-23 DIAGNOSIS — H524 Presbyopia: Secondary | ICD-10-CM | POA: Diagnosis not present

## 2022-09-23 DIAGNOSIS — H401132 Primary open-angle glaucoma, bilateral, moderate stage: Secondary | ICD-10-CM | POA: Diagnosis not present

## 2022-09-23 DIAGNOSIS — Z961 Presence of intraocular lens: Secondary | ICD-10-CM | POA: Diagnosis not present

## 2022-09-23 DIAGNOSIS — H04123 Dry eye syndrome of bilateral lacrimal glands: Secondary | ICD-10-CM | POA: Diagnosis not present

## 2022-09-23 DIAGNOSIS — H5212 Myopia, left eye: Secondary | ICD-10-CM | POA: Diagnosis not present

## 2022-09-23 DIAGNOSIS — H35373 Puckering of macula, bilateral: Secondary | ICD-10-CM | POA: Diagnosis not present

## 2022-09-23 DIAGNOSIS — H52221 Regular astigmatism, right eye: Secondary | ICD-10-CM | POA: Diagnosis not present

## 2022-09-25 NOTE — Progress Notes (Signed)
Patient Care Team: Renaye Rakers, MD as PCP - General (Family Medicine) Serena Croissant, MD as Consulting Physician (Hematology and Oncology)  DIAGNOSIS: No diagnosis found.  SUMMARY OF ONCOLOGIC HISTORY: Oncology History  Breast cancer of lower-outer quadrant of left female breast ([facility])  06/29/2015 Initial Diagnosis   Left breast biopsy LOQ: invasive lobular carcinoma, grade 1, ER 80%, PR 0%, Ki-67 15%, HER-2 negative ratio 1.24; screening detected distortion without ultrasound correlate    07/10/2015 Breast MRI   Left breast posterior lower outer quadrant: Irregular mass and NME 4.3 x 2.3 x 2.2 cm, no lymph nodes, T2 N0 stage II a clinical stage   08/08/2015 Surgery   Left lumpectomy ([facility]): Invasive lobular carcinoma, grade 2, 3.1 cm, broad-based was in the posterior, and inferior margin, LCIS, perineural invasion present, no LV I, ER 80%, PR 0%, HER-2 negative, Ki-67 15%, T2 N1 a stage IIB   08/08/2015 Miscellaneous   Mammaprint: Low risk, luminal type a, 10 year risk of recurrence 10%   09/21/2015 Surgery   Left mastectomy: Invasive lobular cancer, grade 2, ALH, PNI present,    10/22/2015 -  Anti-estrogen oral therapy   Anastrozole 1 mg daily     CHIEF COMPLIANT:   INTERVAL HISTORY: Kiara Ruiz is a   ALLERGIES:  has No Known Allergies.  MEDICATIONS:  Current Outpatient Medications  Medication Sig Dispense Refill   anastrozole (ARIMIDEX) 1 MG tablet Take 1 tablet (1 mg total) by mouth daily. 90 tablet 3   CALCIUM-VITAMIN D PO Take 1 tablet by mouth daily.      latanoprost (XALATAN) 0.005 % ophthalmic solution Place 1 drop into both eyes at bedtime.      Multiple Vitamin (MULTIVITAMIN PO) Take 1 tablet by mouth daily.      timolol (BETIMOL) 0.5 % ophthalmic solution Place 1 drop into both eyes every morning.      No current facility-administered medications for this visit.    PHYSICAL EXAMINATION: ECOG PERFORMANCE STATUS: {CHL ONC ECOG PS:[id number]}  There were  no vitals filed for this visit. There were no vitals filed for this visit.  BREAST:*** No palpable masses or nodules in either right or left breasts. No palpable axillary supraclavicular or infraclavicular adenopathy no breast tenderness or nipple discharge. (exam performed in the presence of a chaperone)  LABORATORY DATA:  I have reviewed the data as listed    Latest Ref Rng & Units 09/21/2015    6:45 PM 07/12/2015    8:26 AM  CMP  Glucose 70 - 140 mg/dl  82   BUN 7.0 - 13.2 mg/dL  44.0   Creatinine 1.02 - 1.00 mg/dL 7.25  0.9   Sodium 366 - 145 mEq/L  142   Potassium 3.5 - 5.1 mEq/L  4.4   CO2 22 - 29 mEq/L  28   Calcium 8.4 - 10.4 mg/dL  9.7   Total Protein 6.4 - 8.3 g/dL  7.5   Total Bilirubin 0.20 - 1.20 mg/dL  4.40   Alkaline Phos 40 - 150 U/L  73   AST 5 - 34 U/L  15   ALT 0 - 55 U/L  13     Lab Results  Component Value Date   WBC 9.0 09/22/2015   HGB 11.2 (L) 09/22/2015   HCT 35.7 (L) 09/22/2015   MCV 95.5 09/22/2015   PLT 193 09/22/2015   NEUTROABS 3.7 07/12/2015    ASSESSMENT & PLAN:  No problem-specific Assessment & Plan notes found for this encounter.  No orders of the defined types were placed in this encounter.  The patient has a good understanding of the overall plan. she agrees with it. she will call with any problems that may develop before the next visit here. Total time spent: 30 mins including face to face time and time spent for planning, charting and co-ordination of care   Sherlyn Lick, CMA 09/25/22    I Janan Ridge am acting as a Neurosurgeon for [employer]  ***

## 2022-09-26 ENCOUNTER — Inpatient Hospital Stay: Payer: Medicare PPO | Attending: Hematology and Oncology | Admitting: Hematology and Oncology

## 2022-09-26 ENCOUNTER — Other Ambulatory Visit: Payer: Self-pay

## 2022-09-26 VITALS — BP 143/80 | HR 67 | Temp 97.9°F | Resp 18 | Ht 62.0 in | Wt 141.3 lb

## 2022-09-26 DIAGNOSIS — Z79811 Long term (current) use of aromatase inhibitors: Secondary | ICD-10-CM | POA: Diagnosis not present

## 2022-09-26 DIAGNOSIS — Z170 Estrogen receptor positive status [ER+]: Secondary | ICD-10-CM | POA: Diagnosis not present

## 2022-09-26 DIAGNOSIS — C50512 Malignant neoplasm of lower-outer quadrant of left female breast: Secondary | ICD-10-CM

## 2022-09-26 DIAGNOSIS — Z9012 Acquired absence of left breast and nipple: Secondary | ICD-10-CM | POA: Insufficient documentation

## 2022-09-26 MED ORDER — ANASTROZOLE 1 MG PO TABS: 1.0000 mg | ORAL_TABLET | Freq: Every day | ORAL | 3 refills | Status: DC

## 2022-09-26 NOTE — Assessment & Plan Note (Signed)
Left lumpectomy 08/08/2015: Invasive lobular carcinoma, grade 2, 3.1 cm, broad-based was in the posterior, and inferior margin, LCIS, perineural invasion present, no LV I, ER 80%, PR 0%, HER-2 negative, Ki-67 15%, T2 N1 a stage IIB Left mastectomy 09/21/2015: Residual invasive lobular cancer grade 2, perineural invasion, ALH Mammaprint: Low risk, luminal A, 10% risk of recurrence in 10 years   Current Treatment: Adjuvant antiestrogen therapy with anastrozole 1 mg by mouth daily for 10 years started 10/22/15 Anastrozole Toxicities:. Patient denies any hot flashes or myalgias.    Surveillance: Mammograms: 12/25/2021 right breast: Benign, breast density category D Breast exam 09/26/2022: Benign   Severe left shoulder Pain subsided now.   CT CAP: 03/29/22 No evidence of met disease Intermittent pain: She will exercise and do stretching.   Return to clinic in 1 year for follow-up

## 2022-10-15 DIAGNOSIS — E1169 Type 2 diabetes mellitus with other specified complication: Secondary | ICD-10-CM | POA: Diagnosis not present

## 2022-10-15 DIAGNOSIS — E559 Vitamin D deficiency, unspecified: Secondary | ICD-10-CM | POA: Diagnosis not present

## 2022-10-15 DIAGNOSIS — I972 Postmastectomy lymphedema syndrome: Secondary | ICD-10-CM | POA: Diagnosis not present

## 2022-10-15 DIAGNOSIS — I10 Essential (primary) hypertension: Secondary | ICD-10-CM | POA: Diagnosis not present

## 2022-10-15 DIAGNOSIS — K5900 Constipation, unspecified: Secondary | ICD-10-CM | POA: Diagnosis not present

## 2022-10-15 DIAGNOSIS — R410 Disorientation, unspecified: Secondary | ICD-10-CM | POA: Diagnosis not present

## 2022-10-15 DIAGNOSIS — E782 Mixed hyperlipidemia: Secondary | ICD-10-CM | POA: Diagnosis not present

## 2022-10-15 DIAGNOSIS — F0280 Dementia in other diseases classified elsewhere without behavioral disturbance: Secondary | ICD-10-CM | POA: Diagnosis not present

## 2022-11-11 DIAGNOSIS — F0280 Dementia in other diseases classified elsewhere without behavioral disturbance: Secondary | ICD-10-CM | POA: Diagnosis not present

## 2022-11-11 DIAGNOSIS — E785 Hyperlipidemia, unspecified: Secondary | ICD-10-CM | POA: Diagnosis not present

## 2022-11-11 DIAGNOSIS — E1169 Type 2 diabetes mellitus with other specified complication: Secondary | ICD-10-CM | POA: Diagnosis not present

## 2022-11-11 DIAGNOSIS — I10 Essential (primary) hypertension: Secondary | ICD-10-CM | POA: Diagnosis not present

## 2022-11-22 ENCOUNTER — Other Ambulatory Visit: Payer: Self-pay | Admitting: Family Medicine

## 2022-11-22 DIAGNOSIS — Z1231 Encounter for screening mammogram for malignant neoplasm of breast: Secondary | ICD-10-CM

## 2022-12-23 ENCOUNTER — Ambulatory Visit
Admission: RE | Admit: 2022-12-23 | Discharge: 2022-12-23 | Disposition: A | Discharge status: Home / Self Care | Payer: Medicare PPO | Source: Ambulatory Visit | Attending: Family Medicine | Admitting: Family Medicine

## 2022-12-23 DIAGNOSIS — Z1231 Encounter for screening mammogram for malignant neoplasm of breast: Secondary | ICD-10-CM

## 2023-02-02 IMAGING — MG MM DIGITAL SCREENING UNILAT*R* W/ TOMO W/ CAD
4 series · 4 of 12 positions shown · non-contrast
Comparison: Previous exam(s).

CLINICAL DATA: Screening.

EXAM:
DIGITAL SCREENING UNILATERAL RIGHT MAMMOGRAM WITH CAD AND
TOMOSYNTHESIS
TECHNIQUE: Right screening digital craniocaudal and mediolateral oblique
mammograms were obtained. Right screening digital breast
tomosynthesis was performed. The images were evaluated with
computer-aided detection.

[R MLO synth-2D]
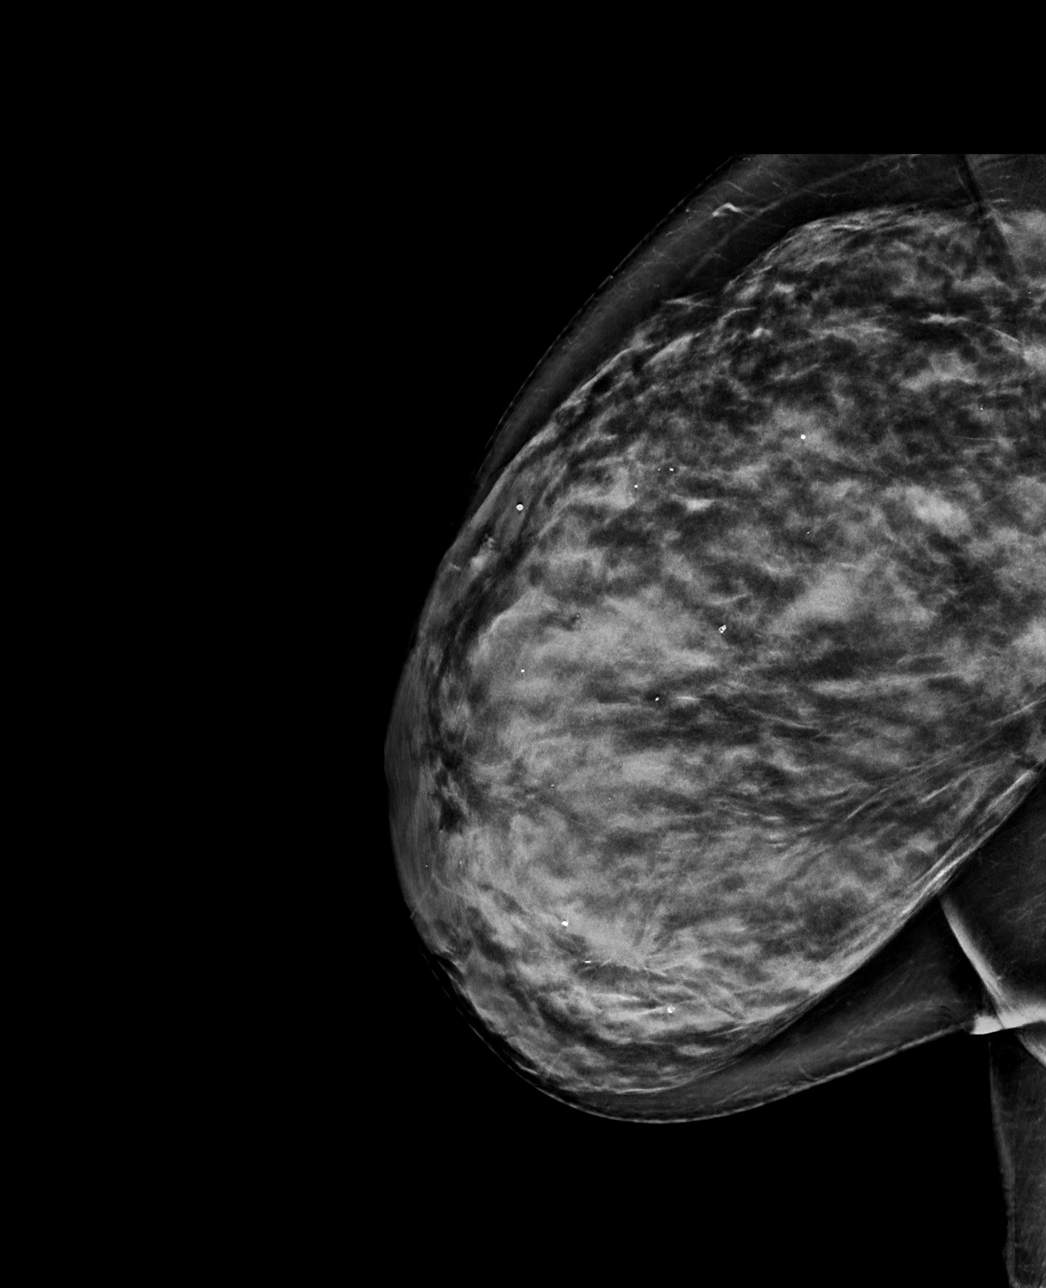

[R CC synth-2D]
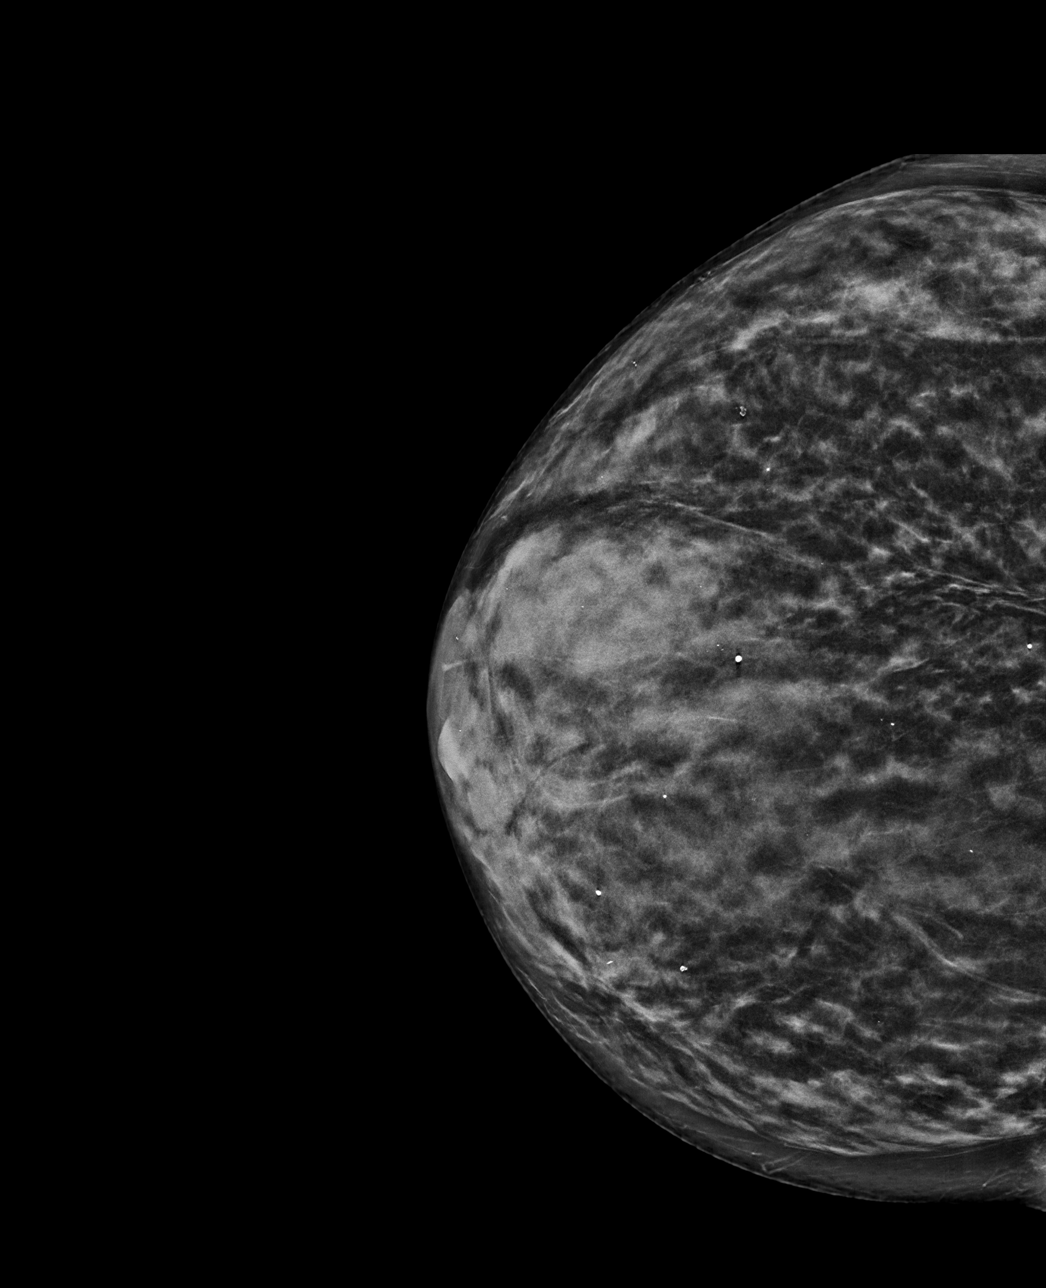

[R MLO tomo · tomo slice 45/90.0]
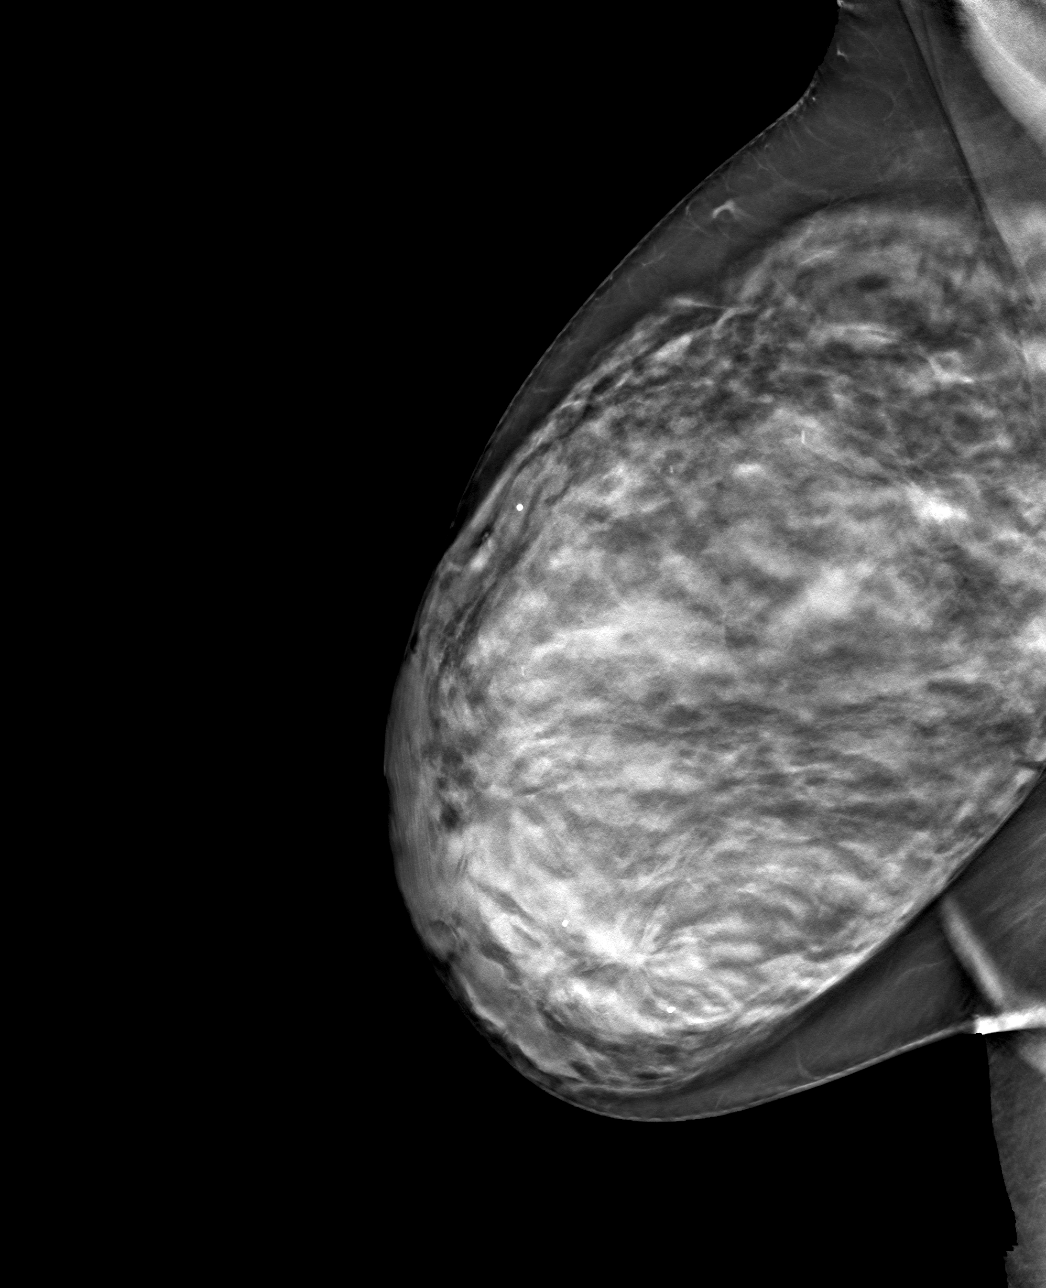

[R CC tomo · tomo slice 37/73.0]
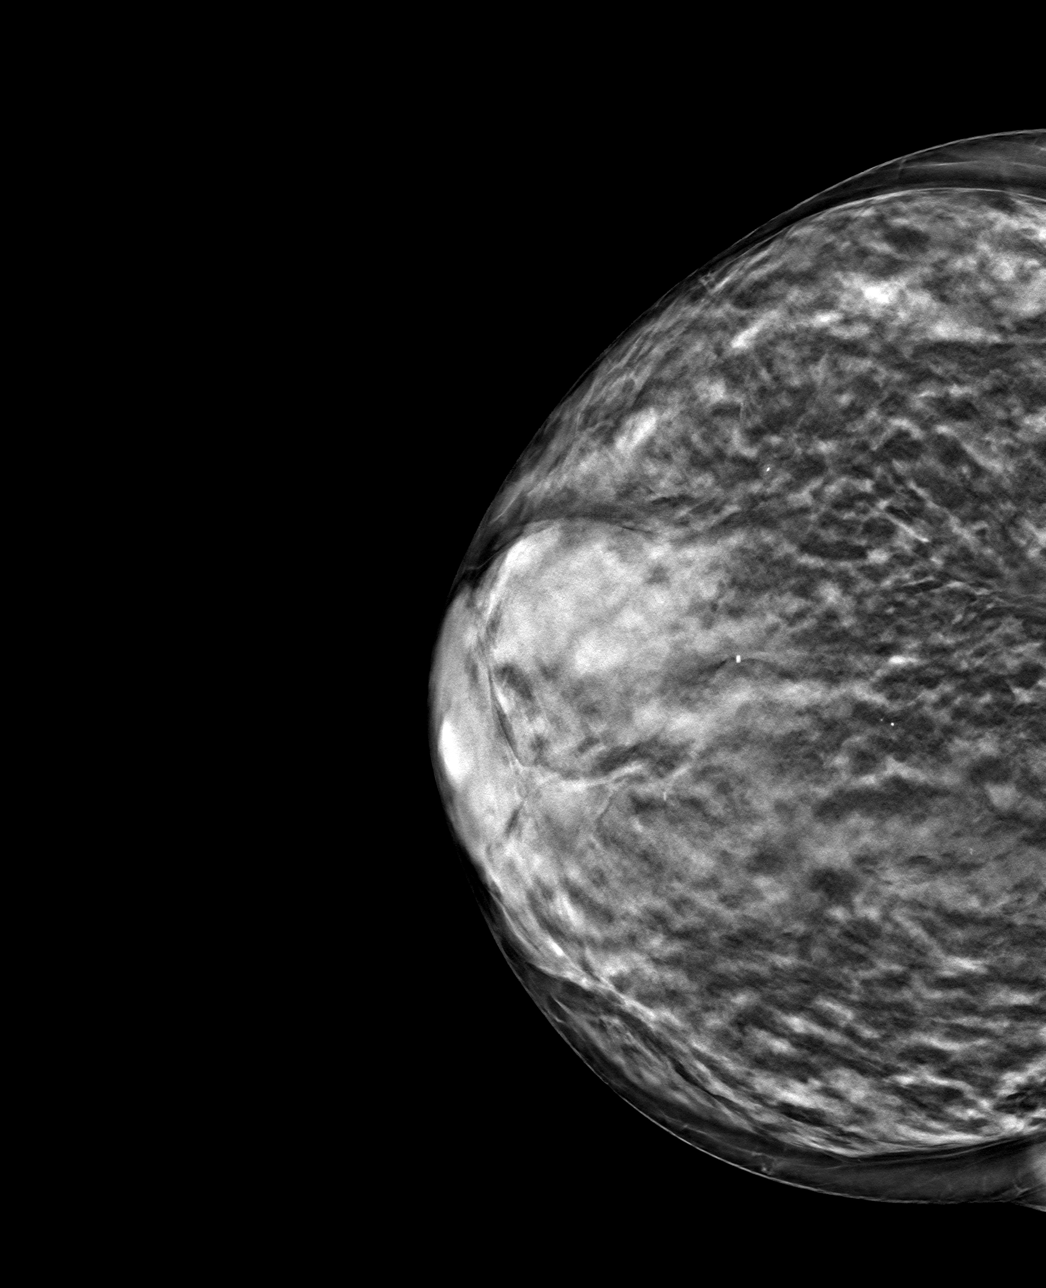

[4 of 12 positions shown; findings below may reference images not displayed]

ACR Breast Density Category c: The breast tissue is heterogeneously
dense, which may obscure small masses.
FINDINGS: The patient has had a left mastectomy. There are no findings
suspicious for malignancy.
IMPRESSION: No mammographic evidence of malignancy. A result letter of this
screening mammogram will be mailed directly to the patient.

RECOMMENDATION:
Screening mammogram in one year.  (Code:[id number]-3H6)

BI-RADS CATEGORY  1: Negative.

## 2023-02-10 DIAGNOSIS — Z0000 Encounter for general adult medical examination without abnormal findings: Secondary | ICD-10-CM | POA: Diagnosis not present

## 2023-02-10 DIAGNOSIS — E785 Hyperlipidemia, unspecified: Secondary | ICD-10-CM | POA: Diagnosis not present

## 2023-02-10 DIAGNOSIS — I10 Essential (primary) hypertension: Secondary | ICD-10-CM | POA: Diagnosis not present

## 2023-02-11 DIAGNOSIS — H401132 Primary open-angle glaucoma, bilateral, moderate stage: Secondary | ICD-10-CM | POA: Diagnosis not present

## 2023-02-11 DIAGNOSIS — H35373 Puckering of macula, bilateral: Secondary | ICD-10-CM | POA: Diagnosis not present

## 2023-02-11 DIAGNOSIS — H04123 Dry eye syndrome of bilateral lacrimal glands: Secondary | ICD-10-CM | POA: Diagnosis not present

## 2023-04-01 DIAGNOSIS — H35373 Puckering of macula, bilateral: Secondary | ICD-10-CM | POA: Diagnosis not present

## 2023-04-01 DIAGNOSIS — Z961 Presence of intraocular lens: Secondary | ICD-10-CM | POA: Diagnosis not present

## 2023-04-01 DIAGNOSIS — H401132 Primary open-angle glaucoma, bilateral, moderate stage: Secondary | ICD-10-CM | POA: Diagnosis not present

## 2023-04-01 DIAGNOSIS — H04123 Dry eye syndrome of bilateral lacrimal glands: Secondary | ICD-10-CM | POA: Diagnosis not present

## 2023-07-22 DIAGNOSIS — E559 Vitamin D deficiency, unspecified: Secondary | ICD-10-CM | POA: Diagnosis not present

## 2023-07-22 DIAGNOSIS — F0280 Dementia in other diseases classified elsewhere without behavioral disturbance: Secondary | ICD-10-CM | POA: Diagnosis not present

## 2023-07-22 DIAGNOSIS — I10 Essential (primary) hypertension: Secondary | ICD-10-CM | POA: Diagnosis not present

## 2023-07-22 DIAGNOSIS — E119 Type 2 diabetes mellitus without complications: Secondary | ICD-10-CM | POA: Diagnosis not present

## 2023-07-22 DIAGNOSIS — M130 Polyarthritis, unspecified: Secondary | ICD-10-CM | POA: Diagnosis not present

## 2023-07-22 DIAGNOSIS — R7309 Other abnormal glucose: Secondary | ICD-10-CM | POA: Diagnosis not present

## 2023-09-29 ENCOUNTER — Inpatient Hospital Stay: Payer: Medicare PPO | Admitting: Hematology and Oncology

## 2023-09-30 DIAGNOSIS — H401132 Primary open-angle glaucoma, bilateral, moderate stage: Secondary | ICD-10-CM | POA: Diagnosis not present

## 2023-09-30 DIAGNOSIS — H35373 Puckering of macula, bilateral: Secondary | ICD-10-CM | POA: Diagnosis not present

## 2023-10-09 ENCOUNTER — Other Ambulatory Visit: Payer: Self-pay | Admitting: Hematology and Oncology

## 2023-10-13 ENCOUNTER — Inpatient Hospital Stay: Admitting: Hematology and Oncology

## 2023-11-04 ENCOUNTER — Inpatient Hospital Stay: Attending: Hematology and Oncology | Admitting: Hematology and Oncology

## 2023-11-04 VITALS — BP 124/72 | HR 92 | Temp 97.8°F | Resp 16 | Wt 139.7 lb

## 2023-11-04 DIAGNOSIS — Z170 Estrogen receptor positive status [ER+]: Secondary | ICD-10-CM | POA: Insufficient documentation

## 2023-11-04 DIAGNOSIS — Z9221 Personal history of antineoplastic chemotherapy: Secondary | ICD-10-CM | POA: Diagnosis not present

## 2023-11-04 DIAGNOSIS — R232 Flushing: Secondary | ICD-10-CM | POA: Insufficient documentation

## 2023-11-04 DIAGNOSIS — Z79811 Long term (current) use of aromatase inhibitors: Secondary | ICD-10-CM | POA: Diagnosis not present

## 2023-11-04 DIAGNOSIS — C50512 Malignant neoplasm of lower-outer quadrant of left female breast: Secondary | ICD-10-CM | POA: Insufficient documentation

## 2023-11-04 DIAGNOSIS — Z9012 Acquired absence of left breast and nipple: Secondary | ICD-10-CM | POA: Diagnosis not present

## 2023-11-04 NOTE — Progress Notes (Signed)
 Patient Care Team: Benjamine Aland, MD as PCP - General (Family Medicine) Odean Potts, MD as Consulting Physician (Hematology and Oncology)  DIAGNOSIS:  Encounter Diagnosis  Name Primary?   Malignant neoplasm of lower-outer quadrant of left breast of female, estrogen receptor positive ([facility]) Yes    SUMMARY OF ONCOLOGIC HISTORY: Oncology History  Breast cancer of lower-outer quadrant of left female breast ([facility])  06/29/2015 Initial Diagnosis   Left breast biopsy LOQ: invasive lobular carcinoma, grade 1, ER 80%, PR 0%, Ki-67 15%, HER-2 negative ratio 1.24; screening detected distortion without ultrasound correlate    07/10/2015 Breast MRI   Left breast posterior lower outer quadrant: Irregular mass and NME 4.3 x 2.3 x 2.2 cm, no lymph nodes, T2 N0 stage II a clinical stage   08/08/2015 Surgery   Left lumpectomy ([facility]): Invasive lobular carcinoma, grade 2, 3.1 cm, broad-based was in the posterior, and inferior margin, LCIS, perineural invasion present, no LV I, ER 80%, PR 0%, HER-2 negative, Ki-67 15%, T2 N1 a stage IIB   08/08/2015 Miscellaneous   Mammaprint: Low risk, luminal type a, 10 year risk of recurrence 10%   09/21/2015 Surgery   Left mastectomy: Invasive lobular cancer, grade 2, ALH, PNI present,    10/22/2015 -  Anti-estrogen oral therapy   Anastrozole  1 mg daily     CHIEF COMPLIANT: Follow-up to discuss antiestrogen treatment  HISTORY OF PRESENT ILLNESS:  History of Present Illness Kiara Ruiz is a 78 year old female who presents for follow-up regarding her anastrozole  treatment.  She has been on anastrozole  since 2017 for estrogen receptor-positive breast cancer, completing eight years of treatment. She has tolerated the medication well without experiencing common side effects such as hot flashes or joint stiffness.     ALLERGIES:  has no known allergies.  MEDICATIONS:  Current Outpatient Medications  Medication Sig Dispense Refill   CALCIUM-VITAMIN D PO Take  1 tablet by mouth daily.      latanoprost (XALATAN) 0.005 % ophthalmic solution Place 1 drop into both eyes at bedtime.      Multiple Vitamin (MULTIVITAMIN PO) Take 1 tablet by mouth daily.      timolol  (BETIMOL ) 0.5 % ophthalmic solution Place 1 drop into both eyes every morning.      No current facility-administered medications for this visit.    PHYSICAL EXAMINATION: ECOG PERFORMANCE STATUS: 1 - Symptomatic but completely ambulatory  Vitals:   11/04/23 1343  BP: 124/72  Pulse: 92  Resp: 16  Temp: 97.8 F (36.6 C)  SpO2: 98%   Filed Weights   11/04/23 1343  Weight: 139 lb 11.2 oz (63.4 kg)   LABORATORY DATA:  I have reviewed the data as listed    Latest Ref Rng & Units 09/21/2015    6:45 PM 07/12/2015    8:26 AM  CMP  Glucose 70 - 140 mg/dl  82   BUN 7.0 - 73.9 mg/dL  88.7   Creatinine 9.55 - 1.00 mg/dL 9.13  0.9   Sodium 863 - 145 mEq/L  142   Potassium 3.5 - 5.1 mEq/L  4.4   CO2 22 - 29 mEq/L  28   Calcium 8.4 - 10.4 mg/dL  9.7   Total Protein 6.4 - 8.3 g/dL  7.5   Total Bilirubin 0.20 - 1.20 mg/dL  9.49   Alkaline Phos 40 - 150 U/L  73   AST 5 - 34 U/L  15   ALT 0 - 55 U/L  13  Lab Results  Component Value Date   WBC 9.0 09/22/2015   HGB 11.2 (L) 09/22/2015   HCT 35.7 (L) 09/22/2015   MCV 95.5 09/22/2015   PLT 193 09/22/2015   NEUTROABS 3.7 07/12/2015    ASSESSMENT & PLAN:  Breast cancer of lower-outer quadrant of left female breast ([facility]) Left lumpectomy 08/08/2015: Invasive lobular carcinoma, grade 2, 3.1 cm, broad-based was in the posterior, and inferior margin, LCIS, perineural invasion present, no LV I, ER 80%, PR 0%, HER-2 negative, Ki-67 15%, T2 N1 a stage IIB Left mastectomy 09/21/2015: Residual invasive lobular cancer grade 2, perineural invasion, ALH Mammaprint: Low risk, luminal A, 10% risk of recurrence in 10 years   Current Treatment: Adjuvant antiestrogen therapy with anastrozole  1 mg by mouth daily started 10/22/15 completed  11/04/2023 Recommended discontinuation of antiestrogen therapy having completed 8 years.   Surveillance: Mammograms: 12/25/2022 right breast: Benign, breast density category D Breast exam 11/04/23: Benign   CT CAP: 03/29/22 No evidence of met disease Intermittent pain: She will exercise and doing stretching.   Return to clinic in 1 year for follow-up ------------------------------------- Assessment and Plan Assessment & Plan Personal history of malignant neoplasm of left breast, status post extended adjuvant anastrozole  therapy Estrogen receptor-positive breast cancer treated with eight years of anastrozole . Therapy extended due to positive response. Decision made to discontinue anastrozole . - Discontinue anastrozole  after finishing current supply. - Continue annual mammogram. - Oncology visits as needed. - Follow up with PCP.      No orders of the defined types were placed in this encounter.  The patient has a good understanding of the overall plan. she agrees with it. she will call with any problems that may develop before the next visit here. Total time spent: 30 mins including face to face time and time spent for planning, charting and co-ordination of care   Kiara MARLA Chad, MD 11/04/23

## 2023-11-04 NOTE — Assessment & Plan Note (Signed)
 Left lumpectomy 08/08/2015: Invasive lobular carcinoma, grade 2, 3.1 cm, broad-based was in the posterior, and inferior margin, LCIS, perineural invasion present, no LV I, ER 80%, PR 0%, HER-2 negative, Ki-67 15%, T2 N1 a stage IIB Left mastectomy 09/21/2015: Residual invasive lobular cancer grade 2, perineural invasion, ALH Mammaprint: Low risk, luminal A, 10% risk of recurrence in 10 years   Current Treatment: Adjuvant antiestrogen therapy with anastrozole  1 mg by mouth daily for 10 years started 10/22/15 Anastrozole  Toxicities:. Patient denies any hot flashes or myalgias.    Surveillance: Mammograms: 12/25/2022 right breast: Benign, breast density category D Breast exam 11/04/23: Benign   CT CAP: 03/29/22 No evidence of met disease Intermittent pain: She will exercise and doing stretching.   Return to clinic in 1 year for follow-up

## 2023-12-22 DIAGNOSIS — E782 Mixed hyperlipidemia: Secondary | ICD-10-CM | POA: Diagnosis not present

## 2023-12-22 DIAGNOSIS — F0280 Dementia in other diseases classified elsewhere without behavioral disturbance: Secondary | ICD-10-CM | POA: Diagnosis not present

## 2023-12-22 DIAGNOSIS — R7309 Other abnormal glucose: Secondary | ICD-10-CM | POA: Diagnosis not present

## 2024-01-04 ENCOUNTER — Other Ambulatory Visit: Payer: Self-pay | Admitting: Hematology and Oncology

## 2024-02-09 ENCOUNTER — Other Ambulatory Visit: Payer: Self-pay | Admitting: Family Medicine

## 2024-02-12 ENCOUNTER — Other Ambulatory Visit: Payer: Self-pay | Admitting: Family Medicine

## 2024-02-12 DIAGNOSIS — Z1231 Encounter for screening mammogram for malignant neoplasm of breast: Secondary | ICD-10-CM

## 2024-02-13 ENCOUNTER — Ambulatory Visit
Admission: RE | Admit: 2024-02-13 | Discharge: 2024-02-13 | Disposition: A | Source: Ambulatory Visit | Attending: Family Medicine | Admitting: Family Medicine

## 2024-02-13 DIAGNOSIS — Z1231 Encounter for screening mammogram for malignant neoplasm of breast: Secondary | ICD-10-CM

## 2024-02-17 LAB — DERMATOLOGY PATHOLOGY

## 2024-03-24 ENCOUNTER — Ambulatory Visit: Admitting: Podiatry

## 2024-03-24 DIAGNOSIS — Q828 Other specified congenital malformations of skin: Secondary | ICD-10-CM | POA: Diagnosis not present

## 2024-03-24 NOTE — Progress Notes (Signed)
"  °  Subjective:  Patient ID: Kiara Ruiz, female    DOB: Dec 20, 1945,  MRN: [id number]  Chief Complaint  Patient presents with   Foot Pain    Pt stated that she has this place on the bottom of her foot causing her pain     79 y.o. female presents with the above complaint.  Patient presents with right submetatarsal 1 porokeratotic lesion painful to touch is progressive and worse worse with ambulation and shoe pressure patient would like to discuss treatment options for it she has not seen her as prior to seeing me denies any other acute complaints.  Pain scale 7 out of 10 dull aching nature.   Review of Systems: Negative except as noted in the HPI. Denies N/V/F/Ch.  Past Medical History:  Diagnosis Date   Arthritis    mainly my feet/toes (09/21/2015)   ASCUS favor benign 09/2013   Negative HR HPV   Breast cancer of lower-outer quadrant of left female breast (HCC) 07/04/2015   GERD (gastroesophageal reflux disease)    hx of, none recently   Glaucoma, both eyes    Osteopenia 03/2017   T score -1.7 FRAX 4.9% / 0.9%   Trigger finger    Current Medications[1]  Tobacco Use History[2]  Allergies[3] Objective:  There were no vitals filed for this visit. There is no height or weight on file to calculate BMI. Constitutional Well developed. Well nourished.  Vascular Dorsalis pedis pulses palpable bilaterally. Posterior tibial pulses palpable bilaterally. Capillary refill normal to all digits.  No cyanosis or clubbing noted. Pedal hair growth normal.  Neurologic Normal speech. Oriented to person, place, and time. Epicritic sensation to light touch grossly present bilaterally.  Dermatologic Right submetatarsal 1 porokeratotic lesion with central nucleated core noted pain on palpation to the lesion.  Orthopedic: Normal joint ROM without pain or crepitus bilaterally. No visible deformities. No bony tenderness.   Radiographs: None Assessment:   1. Porokeratosis    Plan:   Patient was evaluated and treated and all questions answered.  Right submetatarsal 1 porokeratosis - All questions and concerns were discussed with the patient extensive detail given the amount of pain that she is experiencing should benefit from debridement of the lesion using chisel blade to handle the lesion was debride down to healthy stripe tissue no complication noted no pinpoint bleeding noted. - Shoe gear modification discussed.  No follow-ups on file.    [1]  Current Outpatient Medications:    CALCIUM-VITAMIN D PO, Take 1 tablet by mouth daily. , Disp: , Rfl:    latanoprost (XALATAN) 0.005 % ophthalmic solution, Place 1 drop into both eyes at bedtime. , Disp: , Rfl:    Multiple Vitamin (MULTIVITAMIN PO), Take 1 tablet by mouth daily. , Disp: , Rfl:    timolol  (BETIMOL ) 0.5 % ophthalmic solution, Place 1 drop into both eyes every morning. , Disp: , Rfl:  [2]  Social History Tobacco Use  Smoking Status Never  Smokeless Tobacco Never  [3] No Known Allergies  "
